# Patient Record
Sex: Male | Born: 1990 | Race: White | Hispanic: No | Marital: Single | Smoking: Current some day smoker
Health system: Southern US, Community
[De-identification: ages and names within clinical notes are randomized; demographics above are authoritative.]

## PROBLEM LIST (undated history)

## (undated) DIAGNOSIS — E119 Type 2 diabetes mellitus without complications: Secondary | ICD-10-CM

## (undated) DIAGNOSIS — F909 Attention-deficit hyperactivity disorder, unspecified type: Secondary | ICD-10-CM

## (undated) HISTORY — PX: NO PAST SURGERIES: SHX2092

---
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
## 2013-02-21 ENCOUNTER — Encounter (HOSPITAL_COMMUNITY): Payer: Self-pay | Admitting: *Deleted

## 2013-02-21 ENCOUNTER — Emergency Department (HOSPITAL_COMMUNITY)
Admission: EM | Admit: 2013-02-21 | Discharge: 2013-02-21 | Disposition: A | Discharge status: Home / Self Care | Payer: Medicaid Other | Attending: Emergency Medicine | Admitting: Emergency Medicine

## 2013-02-21 DIAGNOSIS — T162XXA Foreign body in left ear, initial encounter: Secondary | ICD-10-CM

## 2013-02-21 DIAGNOSIS — Z79899 Other long term (current) drug therapy: Secondary | ICD-10-CM | POA: Insufficient documentation

## 2013-02-21 DIAGNOSIS — T169XXA Foreign body in ear, unspecified ear, initial encounter: Secondary | ICD-10-CM | POA: Insufficient documentation

## 2013-02-21 DIAGNOSIS — F17200 Nicotine dependence, unspecified, uncomplicated: Secondary | ICD-10-CM | POA: Insufficient documentation

## 2013-02-21 DIAGNOSIS — Y9289 Other specified places as the place of occurrence of the external cause: Secondary | ICD-10-CM | POA: Insufficient documentation

## 2013-02-21 DIAGNOSIS — IMO0002 Reserved for concepts with insufficient information to code with codable children: Secondary | ICD-10-CM | POA: Insufficient documentation

## 2013-02-21 DIAGNOSIS — Y9389 Activity, other specified: Secondary | ICD-10-CM | POA: Insufficient documentation

## 2013-02-21 NOTE — ED Provider Notes (Signed)
CSN: [id number]     Arrival date & time 02/21/13  1925 History  This chart was scribed for non-physician practitioner Antony Madura, PA-C working with Shanna Cisco, MD by Danella Maiers, ED Scribe. This patient was seen in room TR04C/TR04C and the patient's care was started at 8:08 PM.    Chief Complaint  Patient presents with  . Foreign Body in Ear   Patient is a 22 y.o. male presenting with foreign body in ear. The history is provided by the patient. No language interpreter was used.  Foreign Body in Ear This is a new problem. The problem has not changed since onset.  HPI Comments: DEMETRIES COIA is a 21 y.o. male who presents to the Emergency Department complaining of a foreign body in his left ear that occurred tonight. The pt believes that part of a cotton swab got stuck in his left ear while he was cleaning it with a q-tip. Pt denies drainage, ear pain, decreased hearing. Pt is a current everyday smoker and occasional alcohol user.   History reviewed. No pertinent past medical history. History reviewed. No pertinent past surgical history. History reviewed. No pertinent family history. History  Substance Use Topics  . Smoking status: Current Every Day Smoker  . Smokeless tobacco: Not on file  . Alcohol Use: Yes    Review of Systems  HENT: Negative for ear pain and ear discharge.        Foreign body in ear  All other systems reviewed and are negative.    Allergies  Review of patient's allergies indicates not on file.  Home Medications   Current Outpatient Rx  Name  Route  Sig  Dispense  Refill  . acetaminophen (TYLENOL) 500 MG tablet   Oral   Take 1,000 mg by mouth every 6 (six) hours as needed for pain.         Marland Kitchen lisdexamfetamine (VYVANSE) 30 MG capsule   Oral   Take 30 mg by mouth every morning.         . risperidone (RISPERDAL) 4 MG tablet   Oral   Take 4 mg by mouth at bedtime.          BP 140/95  Pulse 113  Temp(Src) 98.3 F (36.8 C) (Oral)   Resp 16  SpO2 97%  Physical Exam  Nursing note and vitals reviewed. Constitutional: He is oriented to person, place, and time. He appears well-developed and well-nourished. No distress.  HENT:  Head: Normocephalic and atraumatic.  Right Ear: Tympanic membrane, external ear and ear canal normal. No mastoid tenderness. No decreased hearing is noted.  Left Ear: External ear normal. No mastoid tenderness. No decreased hearing is noted.  Nose: Nose normal.  Cotton swab deep in ear canal. Unable to visualize TM. Visible canal normal without erythema or edema. No drainage from b/l ears.  Eyes: Conjunctivae and EOM are normal. No scleral icterus.  Neck: Normal range of motion.  Cardiovascular: Regular rhythm and intact distal pulses.   Pulmonary/Chest: Effort normal. No respiratory distress.  Musculoskeletal: Normal range of motion.  Neurological: He is alert and oriented to person, place, and time.  Skin: Skin is warm and dry. No rash noted. He is not diaphoretic. No erythema. No pallor.  Psychiatric: He has a normal mood and affect. His behavior is normal.   ED Course  Medications - No data to display  DIAGNOSTIC STUDIES: Oxygen Saturation is 97% on room air, normal by my interpretation.    COORDINATION OF CARE:  9:13 PM- Discussed treatment plan with pt and pt agrees to plan.  Procedures (including critical care time)  Labs Reviewed - No data to display No results found.  1. Foreign body in ear, left, initial encounter    MDM  Uncomplicated foreign body in left ear - patient well and nontoxic appearing and afebrile. External left ear and visible canal unremarkable. Small amount of cotton visualized deep in left ear canal. Warm water flush to left ear completed to attempt to dislodge foreign body without success. Patient appropriate for discharge with ENT followup for further evaluation of symptoms. Indications for ED return discussed and patient agreeable to plan with no unaddressed  concerns.  I personally performed the services described in this documentation, which was scribed in my presence. The recorded information has been reviewed and is accurate.     Antony Madura, PA-C 02/22/13 1441

## 2013-02-21 NOTE — ED Notes (Signed)
Pt states that he was cleaning his left ear this afternoon with a q-tip and the cotton ball did not come out with the stick.

## 2013-02-24 NOTE — ED Provider Notes (Signed)
Medical screening examination/treatment/procedure(s) were performed by non-physician practitioner and as supervising physician I was immediately available for consultation/collaboration.   Dulcinea Kinser E Christopher Hink, MD 02/24/13 0655 

## 2013-03-04 ENCOUNTER — Encounter (HOSPITAL_COMMUNITY): Payer: Self-pay | Admitting: Emergency Medicine

## 2013-03-04 ENCOUNTER — Emergency Department (HOSPITAL_COMMUNITY)
Admission: EM | Admit: 2013-03-04 | Discharge: 2013-03-05 | Disposition: A | Discharge status: Home / Self Care | Payer: Medicaid Other | Attending: Emergency Medicine | Admitting: Emergency Medicine

## 2013-03-04 DIAGNOSIS — H60399 Other infective otitis externa, unspecified ear: Secondary | ICD-10-CM | POA: Insufficient documentation

## 2013-03-04 DIAGNOSIS — IMO0002 Reserved for concepts with insufficient information to code with codable children: Secondary | ICD-10-CM | POA: Insufficient documentation

## 2013-03-04 DIAGNOSIS — T169XXA Foreign body in ear, unspecified ear, initial encounter: Secondary | ICD-10-CM | POA: Insufficient documentation

## 2013-03-04 DIAGNOSIS — H6091 Unspecified otitis externa, right ear: Secondary | ICD-10-CM

## 2013-03-04 DIAGNOSIS — F17200 Nicotine dependence, unspecified, uncomplicated: Secondary | ICD-10-CM | POA: Insufficient documentation

## 2013-03-04 DIAGNOSIS — T162XXD Foreign body in left ear, subsequent encounter: Secondary | ICD-10-CM

## 2013-03-04 DIAGNOSIS — Z79899 Other long term (current) drug therapy: Secondary | ICD-10-CM | POA: Insufficient documentation

## 2013-03-04 DIAGNOSIS — Y929 Unspecified place or not applicable: Secondary | ICD-10-CM | POA: Insufficient documentation

## 2013-03-04 DIAGNOSIS — Y939 Activity, unspecified: Secondary | ICD-10-CM | POA: Insufficient documentation

## 2013-03-04 MED ORDER — CIPROFLOXACIN-DEXAMETHASONE 0.3-0.1 % OT SUSP
4.0000 [drp] | Freq: Two times a day (BID) | OTIC | Status: DC
  Administered 2013-03-04: 4 [drp] via OTIC
  Filled 2013-03-04: qty 7.5

## 2013-03-04 NOTE — ED Provider Notes (Signed)
CSN: [id number]     Arrival date & time 03/04/13  2226 History  This chart was scribed for non-physician practitioner Jaynie Crumble, PA-C working with Flint Melter, MD by Valera Castle, ED scribe. This patient was seen in room TR05C/TR05C and the patient's care was started at 10:51 PM.    Chief Complaint  Patient presents with  . Otalgia  . Foreign Body in Ear    The history is provided by the patient. No language interpreter was used.   HPI Comments: Nathan Cruz is a 22 y.o. male who presents to the Emergency Department complaining of constant, moderate left ear pain due to a foreign body in his left ear. He states that the but of a Q-tip became He reports that he was seen here in the ED on 02/21/2013 and was referred to ENT. Pt states he was here last week. He reports that the ED stated the swab was in too deep an referred him to ENT. He reports that the ENT would not see him due to insurance/financial issues. He reports that he thought the swab fell out, but the pain in his ear has since worsened. He reports ear discharge in his right ear as an associated symptoms.    History reviewed. No pertinent past medical history. History reviewed. No pertinent past surgical history. No family history on file. History  Substance Use Topics  . Smoking status: Current Every Day Smoker  . Smokeless tobacco: Not on file  . Alcohol Use: Yes    Review of Systems  HENT: Positive for ear pain (left ear) and ear discharge (right ear).   All other systems reviewed and are negative.    Allergies  Prednisone  Home Medications   Current Outpatient Rx  Name  Route  Sig  Dispense  Refill  . acetaminophen (TYLENOL) 500 MG tablet   Oral   Take 1,000 mg by mouth every 6 (six) hours as needed for pain.         Marland Kitchen lisdexamfetamine (VYVANSE) 30 MG capsule   Oral   Take 30 mg by mouth every morning.         . risperidone (RISPERDAL) 4 MG tablet   Oral   Take 4 mg by mouth at  bedtime.          Triage Vitals: BP 122/72  Pulse 103  Temp(Src) 99.3 F (37.4 C) (Oral)  Resp 14  SpO2 97%  Physical Exam  Nursing note and vitals reviewed. Constitutional: He is oriented to person, place, and time. He appears well-developed and well-nourished.  HENT:  Head: Normocephalic and atraumatic.  White fuzzy foreign body in left ear canal. Ear canal appears edematous, purulent drainage noted. Swelling in his right ear canal with purulent drainage. TM normal.   Eyes: EOM are normal.  Neck: Normal range of motion. Neck supple.  Cardiovascular: Normal rate.   Pulmonary/Chest: Effort normal.  Musculoskeletal: Normal range of motion.  Lymphadenopathy:    He has no cervical adenopathy.  Neurological: He is alert and oriented to person, place, and time.  Skin: Skin is warm and dry.  Psychiatric: He has a normal mood and affect. His behavior is normal.    ED Course  FOREIGN BODY REMOVAL Date/Time: 03/04/2013 11:35 PM Performed by: Jaynie Crumble A Authorized by: Jaynie Crumble A Consent: Verbal consent obtained. written consent not obtained. Risks and benefits: risks, benefits and alternatives were discussed Consent given by: patient Patient understanding: patient states understanding of the procedure being performed  Patient identity confirmed: verbally with patient and arm band Time out: Immediately prior to procedure a "time out" was called to verify the correct patient, procedure, equipment, support staff and site/side marked as required. Body area: ear Local anesthetic: topical anesthetic Anesthetic total: 2 drops Patient sedated: no Patient restrained: no Patient cooperative: yes Localization method: visualized Removal mechanism: alligator forceps Complexity: simple 1 objects recovered. Objects recovered: tip of the Q-tip Post-procedure assessment: foreign body removed Patient tolerance: Patient tolerated the procedure well with no immediate  complications.   (including critical care time)  DIAGNOSTIC STUDIES: Oxygen Saturation is 97% on room air, normal by my interpretation.    COORDINATION OF CARE: 11:00 PM-Discussed treatment plan which includes removal of the foreign object with pt at bedside and pt agreed to plan. Discussed clinical suspicion of an infection in his right ear.   11:19 PM Removed foreign object from left ear. Advised pt to use ear drops to help with both ears.   Labs Review Labs Reviewed - No data to display Imaging Review No results found.  MDM   1. Foreign body in ear, left, subsequent encounter   2. Otitis externa of right ear     Patient with a tip of the Q-tip in his ear for a week. Was seen a week ago, they were unable to remove the Q-tip from his ear. He was referred to ENT Dr. who he was unable to see because they would not accept his insurance. Patient states that now appears becoming more painful. Draining. Patient also reports drainage and swelling of the right ear canal as well. On exam he does have right otitis externa. He does have visualized Q-tip in his left ear canal. I was able to remove it using alligator forceps. Patient tolerated procedure well. I will treat his right otitis externa with Ciprodex ear drops. He'll followup with his doctor as needed  I personally performed the services described in this documentation, which was scribed in my presence. The recorded information has been reviewed and is accurate.   Lottie Mussel, PA-C 03/04/13 2340

## 2013-03-04 NOTE — ED Notes (Signed)
Pt. reports left ear ache / foreign object ( cotton bud) at left ear , seen here last 8/24 and was referred to otolaryngologist .

## 2013-03-04 NOTE — ED Notes (Signed)
Pharmacy paged for Ciprodex, ED pyxis not loaded with medication

## 2013-03-05 NOTE — ED Provider Notes (Signed)
Medical screening examination/treatment/procedure(s) were performed by non-physician practitioner and as supervising physician I was immediately available for consultation/collaboration.  Amdrew Oboyle L Layia Walla, MD 03/05/13 0043 

## 2014-12-17 ENCOUNTER — Emergency Department (INDEPENDENT_AMBULATORY_CARE_PROVIDER_SITE_OTHER)
Admission: EM | Admit: 2014-12-17 | Discharge: 2014-12-17 | Disposition: A | Discharge status: Home / Self Care | Payer: Medicaid Other | Source: Home / Self Care | Attending: Emergency Medicine | Admitting: Emergency Medicine

## 2014-12-17 ENCOUNTER — Encounter (HOSPITAL_COMMUNITY): Payer: Self-pay | Admitting: Emergency Medicine

## 2014-12-17 DIAGNOSIS — H6123 Impacted cerumen, bilateral: Secondary | ICD-10-CM

## 2014-12-17 MED ORDER — NEOMYCIN-POLYMYXIN-HC 3.5-10000-1 OT SUSP
OTIC | Status: AC
  Filled 2014-12-17: qty 10

## 2014-12-17 MED ORDER — CIPROFLOXACIN-DEXAMETHASONE 0.3-0.1 % OT SUSP: 4.0000 [drp] | Freq: Two times a day (BID) | OTIC | Status: DC

## 2014-12-17 NOTE — Discharge Instructions (Signed)
You had ear wax blocking the ears. We removed this today, you have a little bit of irritation in your ear canals from this. Use the Ciprodex eardrops twice a day for the next 5 days to prevent infection. If your ears start to feel stopped up, put a drop of olive oil or vegetable oil in each ear at bedtime. Follow-up as needed.

## 2014-12-17 NOTE — ED Notes (Signed)
C/o right ear fullness onset 1 month; voices no other concerns Has been using brother Rx for Ciprodex ear drops w/no relief Alert, no signs of acute distress.

## 2014-12-17 NOTE — ED Provider Notes (Signed)
CSN: [id number]     Arrival date & time 12/17/14  1516 History   First MD Initiated Contact with Patient 12/17/14 1551     Chief Complaint  Patient presents with  . Ear Fullness   (Consider location/radiation/quality/duration/timing/severity/associated sxs/prior Treatment) HPI He is a 24 year old man here for evaluation of right ear fullness. He states this is been going on for about a month, but worse in the last few days. He reports a full sensation all the time. With hiccuping or burping he will have brief pain in the right ear. His hearing is decreased on that side. No fevers or chills. No drainage from the ear.  History reviewed. No pertinent past medical history. History reviewed. No pertinent past surgical history. No family history on file. History  Substance Use Topics  . Smoking status: Current Every Day Smoker  . Smokeless tobacco: Not on file  . Alcohol Use: Yes    Review of Systems As in history of present illness Allergies  Prednisone  Home Medications   Prior to Admission medications   Medication Sig Start Date End Date Taking? Authorizing Provider  risperidone (RISPERDAL) 4 MG tablet Take 4 mg by mouth at bedtime.   Yes Historical Provider, MD  acetaminophen (TYLENOL) 500 MG tablet Take 1,000 mg by mouth every 6 (six) hours as needed for pain.    Historical Provider, MD  lisdexamfetamine (VYVANSE) 30 MG capsule Take 30 mg by mouth every morning.    Historical Provider, MD   BP 118/82 mmHg  Pulse 99  Temp(Src) 98.5 F (36.9 C) (Oral)  Resp 16  SpO2 100% Physical Exam  Constitutional: He is oriented to person, place, and time. He appears well-developed and well-nourished. No distress.  HENT:  Bilateral eardrums are obscured by cerumen. Cerumen appears compacted in the right ear.  Eyes: Conjunctivae are normal.  Cardiovascular: Normal rate.   Pulmonary/Chest: Effort normal.  Neurological: He is alert and oriented to person, place, and time.    ED Course   Procedures (including critical care time) Labs Review Labs Reviewed - No data to display  Imaging Review No results found.   MDM  No diagnosis found. Bilateral ears washed out.  Bilateral TMs are clear after washout. His symptoms are resolved. There is a little erythema of the ear canals from the wax removal. We'll place on Ciprodex eardrops for 5 days to prevent infection. Follow-up as needed.    Melony Overly, MD 12/17/14 1655

## 2015-08-01 DIAGNOSIS — H6121 Impacted cerumen, right ear: Secondary | ICD-10-CM | POA: Diagnosis not present

## 2016-02-28 DIAGNOSIS — F250 Schizoaffective disorder, bipolar type: Secondary | ICD-10-CM | POA: Diagnosis not present

## 2016-06-07 DIAGNOSIS — Z23 Encounter for immunization: Secondary | ICD-10-CM | POA: Diagnosis not present

## 2016-12-10 ENCOUNTER — Encounter (HOSPITAL_COMMUNITY): Payer: Self-pay | Admitting: *Deleted

## 2016-12-10 ENCOUNTER — Ambulatory Visit (HOSPITAL_COMMUNITY)
Admission: EM | Admit: 2016-12-10 | Discharge: 2016-12-10 | Disposition: A | Discharge status: Home / Self Care | Payer: Medicaid Other | Attending: Family Medicine | Admitting: Family Medicine

## 2016-12-10 DIAGNOSIS — R05 Cough: Secondary | ICD-10-CM | POA: Diagnosis not present

## 2016-12-10 DIAGNOSIS — R059 Cough, unspecified: Secondary | ICD-10-CM

## 2016-12-10 DIAGNOSIS — H6121 Impacted cerumen, right ear: Secondary | ICD-10-CM

## 2016-12-10 MED ORDER — BENZONATATE 100 MG PO CAPS: 100.0000 mg | ORAL_CAPSULE | Freq: Three times a day (TID) | ORAL | 0 refills | Status: DC

## 2016-12-10 NOTE — ED Triage Notes (Signed)
Pt  Reports    Symptoms  Of  Cough   Congested     With     Symptoms  For  Over    1  Month     -  Pt  Also  Reports   He  Has   Ear    Feeling   Stopped  Up  As   Well    Pt   Sitting  Upright  On the  Exam table   Speaking  In  Complete   sentances

## 2016-12-10 NOTE — ED Provider Notes (Signed)
CSN: [id number]     Arrival date & time 12/10/16  1001 History   None    Chief Complaint  Patient presents with  . URI   (Consider location/radiation/quality/duration/timing/severity/associated sxs/prior Treatment) Patient c/o right ear cerumen impaction and cough.   The history is provided by the patient.  URI  Presenting symptoms: cough and ear pain   Severity:  Moderate Onset quality:  Sudden Duration:  1 day Timing:  Constant Progression:  Worsening Chronicity:  New Relieved by:  Nothing Worsened by:  Nothing Ineffective treatments:  None tried   History reviewed. No pertinent past medical history. History reviewed. No pertinent surgical history. History reviewed. No pertinent family history. Social History  Substance Use Topics  . Smoking status: Current Every Day Smoker  . Smokeless tobacco: Not on file  . Alcohol use Yes    Review of Systems  Constitutional: Negative.   HENT: Positive for ear pain.   Eyes: Negative.   Respiratory: Positive for cough.   Cardiovascular: Negative.   Gastrointestinal: Negative.   Endocrine: Negative.   Genitourinary: Negative.   Musculoskeletal: Negative.   Allergic/Immunologic: Negative.   Neurological: Negative.   Hematological: Negative.   Psychiatric/Behavioral: Negative.     Allergies  Prednisone  Home Medications   Prior to Admission medications   Medication Sig Start Date End Date Taking? Authorizing Provider  acetaminophen (TYLENOL) 500 MG tablet Take 1,000 mg by mouth every 6 (six) hours as needed for pain.    [provider]  ciprofloxacin-dexamethasone (CIPRODEX) otic suspension Place 4 drops into both ears 2 (two) times daily. For 5 days 12/17/14   Melony Overly, MD  lisdexamfetamine (VYVANSE) 30 MG capsule Take 30 mg by mouth every morning.    [provider]  risperidone (RISPERDAL) 4 MG tablet Take 4 mg by mouth at bedtime.    [provider]   Meds Ordered and Administered  this Visit  Medications - No data to display  BP 132/78 (BP Location: Right Arm)   Pulse 72   Temp 98.6 F (37 C) (Oral)   Resp 18   SpO2 100%  No data found.   Physical Exam  Constitutional: He is oriented to person, place, and time. He appears well-developed and well-nourished.  HENT:  Head: Normocephalic and atraumatic.  Left Ear: External ear normal.  Mouth/Throat: Oropharynx is clear and moist.  AD cerumen impaction.  After irrigation right EAC is clear and TM is wnl.  Left TM and EAC wnl  Eyes: Conjunctivae and EOM are normal. Pupils are equal, round, and reactive to light.  Neck: Normal range of motion. Neck supple.  Cardiovascular: Normal rate, regular rhythm and normal heart sounds.   Pulmonary/Chest: Effort normal and breath sounds normal.  Neurological: He is alert and oriented to person, place, and time.  Nursing note and vitals reviewed.   Urgent Care Course     Procedures (including critical care time)  Labs Review Labs Reviewed - No data to display  Imaging Review No results found.   Visual Acuity Review  Right Eye Distance:   Left Eye Distance:   Bilateral Distance:    Right Eye Near:   Left Eye Near:    Bilateral Near:         MDM   1. Impacted cerumen of right ear   2. Cough    Irrigation right ear Tessalon Perles 100mg  one po tid prn cough #21     Lysbeth Penner, FNP 12/10/16 1129

## 2017-01-01 ENCOUNTER — Encounter (HOSPITAL_BASED_OUTPATIENT_CLINIC_OR_DEPARTMENT_OTHER): Payer: Self-pay | Admitting: *Deleted

## 2017-01-01 ENCOUNTER — Emergency Department (HOSPITAL_BASED_OUTPATIENT_CLINIC_OR_DEPARTMENT_OTHER)
Admission: EM | Admit: 2017-01-01 | Discharge: 2017-01-01 | Disposition: A | Discharge status: Home / Self Care | Payer: Medicare Other | Attending: Emergency Medicine | Admitting: Emergency Medicine

## 2017-01-01 DIAGNOSIS — X19XXXA Contact with other heat and hot substances, initial encounter: Secondary | ICD-10-CM | POA: Insufficient documentation

## 2017-01-01 DIAGNOSIS — Y999 Unspecified external cause status: Secondary | ICD-10-CM | POA: Insufficient documentation

## 2017-01-01 DIAGNOSIS — F17200 Nicotine dependence, unspecified, uncomplicated: Secondary | ICD-10-CM | POA: Insufficient documentation

## 2017-01-01 DIAGNOSIS — T25222A Burn of second degree of left foot, initial encounter: Secondary | ICD-10-CM | POA: Diagnosis not present

## 2017-01-01 DIAGNOSIS — Y929 Unspecified place or not applicable: Secondary | ICD-10-CM | POA: Insufficient documentation

## 2017-01-01 DIAGNOSIS — Z79899 Other long term (current) drug therapy: Secondary | ICD-10-CM | POA: Diagnosis not present

## 2017-01-01 DIAGNOSIS — S99922A Unspecified injury of left foot, initial encounter: Secondary | ICD-10-CM | POA: Diagnosis present

## 2017-01-01 DIAGNOSIS — Y939 Activity, unspecified: Secondary | ICD-10-CM | POA: Diagnosis not present

## 2017-01-01 DIAGNOSIS — X58XXXA Exposure to other specified factors, initial encounter: Secondary | ICD-10-CM | POA: Diagnosis not present

## 2017-01-01 DIAGNOSIS — T310 Burns involving less than 10% of body surface: Secondary | ICD-10-CM | POA: Diagnosis not present

## 2017-01-01 DIAGNOSIS — Z23 Encounter for immunization: Secondary | ICD-10-CM | POA: Insufficient documentation

## 2017-01-01 MED ORDER — BACITRACIN ZINC 500 UNIT/GM EX OINT
TOPICAL_OINTMENT | CUTANEOUS | Status: AC
  Filled 2017-01-01: qty 28.35

## 2017-01-01 MED ORDER — BACITRACIN ZINC 500 UNIT/GM EX OINT
TOPICAL_OINTMENT | Freq: Once | CUTANEOUS | Status: AC
  Administered 2017-01-01: 23:00:00 via TOPICAL

## 2017-01-01 MED ORDER — NAPROXEN 500 MG PO TABS
500.0000 mg | ORAL_TABLET | Freq: Two times a day (BID) | ORAL | 0 refills | Status: DC
Start: 2017-01-01 — End: 2018-08-23

## 2017-01-01 MED ORDER — NAPROXEN 250 MG PO TABS
500.0000 mg | ORAL_TABLET | Freq: Once | ORAL | Status: AC
  Administered 2017-01-01: 500 mg via ORAL
  Filled 2017-01-01: qty 2

## 2017-01-01 MED ORDER — TETANUS-DIPHTH-ACELL PERTUSSIS 5-2.5-18.5 LF-MCG/0.5 IM SUSP
0.5000 mL | Freq: Once | INTRAMUSCULAR | Status: AC
  Administered 2017-01-01: 0.5 mL via INTRAMUSCULAR

## 2017-01-01 MED ORDER — TETANUS-DIPHTH-ACELL PERTUSSIS 5-2.5-18.5 LF-MCG/0.5 IM SUSP
INTRAMUSCULAR | Status: AC
  Filled 2017-01-01: qty 0.5

## 2017-01-01 NOTE — ED Triage Notes (Signed)
He stepped on hot charcoal an hour ago. Blisters noted to the bottom of his left foot. He took Aleve.
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
## 2017-01-01 NOTE — Discharge Instructions (Signed)
Take the Naprosyn as needed every 12 hours. Elevate left foot is much as possible. Soak it in warm water for 20 minutes twice a day. Then apply antibiotic ointment packs a trace and Polysporin or Neosporin to the open wounds. Apply dressing. Use crutches as directed. Do not want any weightbearing on the foot into the blister settle down. Return here for wound recheck in 2 days. That would be on Friday.

## 2017-01-03 ENCOUNTER — Emergency Department (HOSPITAL_BASED_OUTPATIENT_CLINIC_OR_DEPARTMENT_OTHER)
Admission: EM | Admit: 2017-01-03 | Discharge: 2017-01-03 | Disposition: A | Discharge status: Home / Self Care | Payer: Medicare Other | Attending: Emergency Medicine | Admitting: Emergency Medicine

## 2017-01-03 ENCOUNTER — Encounter (HOSPITAL_BASED_OUTPATIENT_CLINIC_OR_DEPARTMENT_OTHER): Payer: Self-pay | Admitting: Emergency Medicine

## 2017-01-03 DIAGNOSIS — T25222D Burn of second degree of left foot, subsequent encounter: Secondary | ICD-10-CM | POA: Diagnosis not present

## 2017-01-03 DIAGNOSIS — Y9289 Other specified places as the place of occurrence of the external cause: Secondary | ICD-10-CM | POA: Diagnosis not present

## 2017-01-03 DIAGNOSIS — T25222A Burn of second degree of left foot, initial encounter: Secondary | ICD-10-CM | POA: Diagnosis not present

## 2017-01-03 DIAGNOSIS — Y939 Activity, unspecified: Secondary | ICD-10-CM | POA: Insufficient documentation

## 2017-01-03 DIAGNOSIS — X19XXXD Contact with other heat and hot substances, subsequent encounter: Secondary | ICD-10-CM | POA: Insufficient documentation

## 2017-01-03 DIAGNOSIS — Y999 Unspecified external cause status: Secondary | ICD-10-CM | POA: Insufficient documentation

## 2017-01-03 MED ORDER — SILVER SULFADIAZINE 1 % EX CREA: 1.0000 "application " | TOPICAL_CREAM | Freq: Every day | CUTANEOUS | 0 refills | Status: DC

## 2017-01-03 MED ORDER — SILVER SULFADIAZINE 1 % EX CREA
TOPICAL_CREAM | CUTANEOUS | Status: AC
  Filled 2017-01-03: qty 85

## 2017-01-03 MED ORDER — SILVER SULFADIAZINE 1 % EX CREA
TOPICAL_CREAM | Freq: Once | CUTANEOUS | Status: AC
  Administered 2017-01-03: 15:00:00 via TOPICAL

## 2017-01-03 NOTE — ED Triage Notes (Signed)
Patient reports here for foot recheck per MD order.
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
## 2017-01-06 DIAGNOSIS — T25222A Burn of second degree of left foot, initial encounter: Secondary | ICD-10-CM | POA: Diagnosis not present

## 2017-01-06 DIAGNOSIS — T25232A Burn of second degree of left toe(s) (nail), initial encounter: Secondary | ICD-10-CM | POA: Insufficient documentation

## 2017-01-14 DIAGNOSIS — T25232A Burn of second degree of left toe(s) (nail), initial encounter: Secondary | ICD-10-CM | POA: Diagnosis not present

## 2017-01-14 DIAGNOSIS — T25222A Burn of second degree of left foot, initial encounter: Secondary | ICD-10-CM | POA: Diagnosis not present

## 2017-01-28 DIAGNOSIS — Z09 Encounter for follow-up examination after completed treatment for conditions other than malignant neoplasm: Secondary | ICD-10-CM | POA: Diagnosis not present

## 2017-05-09 DIAGNOSIS — Z23 Encounter for immunization: Secondary | ICD-10-CM | POA: Diagnosis not present

## 2018-04-24 DIAGNOSIS — Z23 Encounter for immunization: Secondary | ICD-10-CM | POA: Diagnosis not present

## 2018-08-23 ENCOUNTER — Other Ambulatory Visit: Payer: Self-pay

## 2018-08-23 ENCOUNTER — Ambulatory Visit (HOSPITAL_COMMUNITY)
Admission: EM | Admit: 2018-08-23 | Discharge: 2018-08-23 | Disposition: A | Discharge status: Home / Self Care | Payer: Medicare Other | Attending: Internal Medicine | Admitting: Internal Medicine

## 2018-08-23 ENCOUNTER — Encounter (HOSPITAL_COMMUNITY): Payer: Self-pay | Admitting: Emergency Medicine

## 2018-08-23 DIAGNOSIS — R51 Headache: Secondary | ICD-10-CM

## 2018-08-23 DIAGNOSIS — G8929 Other chronic pain: Secondary | ICD-10-CM

## 2018-08-23 HISTORY — DX: Attention-deficit hyperactivity disorder, unspecified type: F90.9

## 2018-08-23 NOTE — ED Triage Notes (Signed)
The patient presented to the [facility] with a complaint of a headache x 1 week. The patient reported on and off headaches x 1 year.
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
## 2018-09-09 DIAGNOSIS — E1165 Type 2 diabetes mellitus with hyperglycemia: Secondary | ICD-10-CM | POA: Diagnosis not present

## 2018-09-09 DIAGNOSIS — Z13220 Encounter for screening for lipoid disorders: Secondary | ICD-10-CM | POA: Diagnosis not present

## 2018-09-09 DIAGNOSIS — G44229 Chronic tension-type headache, not intractable: Secondary | ICD-10-CM | POA: Diagnosis not present

## 2019-03-25 DIAGNOSIS — E663 Overweight: Secondary | ICD-10-CM | POA: Diagnosis not present

## 2019-03-25 DIAGNOSIS — Z23 Encounter for immunization: Secondary | ICD-10-CM | POA: Diagnosis not present

## 2019-03-25 DIAGNOSIS — Z8659 Personal history of other mental and behavioral disorders: Secondary | ICD-10-CM | POA: Diagnosis not present

## 2019-03-25 DIAGNOSIS — E1169 Type 2 diabetes mellitus with other specified complication: Secondary | ICD-10-CM | POA: Diagnosis not present

## 2019-03-25 DIAGNOSIS — E119 Type 2 diabetes mellitus without complications: Secondary | ICD-10-CM | POA: Diagnosis not present

## 2019-03-25 DIAGNOSIS — Z113 Encounter for screening for infections with a predominantly sexual mode of transmission: Secondary | ICD-10-CM | POA: Diagnosis not present

## 2019-03-25 DIAGNOSIS — R51 Headache: Secondary | ICD-10-CM | POA: Diagnosis not present

## 2019-04-06 DIAGNOSIS — E663 Overweight: Secondary | ICD-10-CM | POA: Diagnosis not present

## 2019-04-14 DIAGNOSIS — E1169 Type 2 diabetes mellitus with other specified complication: Secondary | ICD-10-CM | POA: Diagnosis not present

## 2019-04-14 DIAGNOSIS — R7989 Other specified abnormal findings of blood chemistry: Secondary | ICD-10-CM | POA: Diagnosis not present

## 2019-04-14 DIAGNOSIS — E663 Overweight: Secondary | ICD-10-CM | POA: Diagnosis not present

## 2019-05-12 DIAGNOSIS — E8809 Other disorders of plasma-protein metabolism, not elsewhere classified: Secondary | ICD-10-CM | POA: Diagnosis not present

## 2019-05-12 DIAGNOSIS — E1169 Type 2 diabetes mellitus with other specified complication: Secondary | ICD-10-CM | POA: Diagnosis not present

## 2019-05-12 DIAGNOSIS — Z8659 Personal history of other mental and behavioral disorders: Secondary | ICD-10-CM | POA: Diagnosis not present

## 2019-05-12 DIAGNOSIS — E663 Overweight: Secondary | ICD-10-CM | POA: Diagnosis not present

## 2019-05-12 DIAGNOSIS — R519 Headache, unspecified: Secondary | ICD-10-CM | POA: Diagnosis not present

## 2019-05-12 DIAGNOSIS — R7989 Other specified abnormal findings of blood chemistry: Secondary | ICD-10-CM | POA: Diagnosis not present

## 2019-09-03 ENCOUNTER — Encounter: Payer: Self-pay | Admitting: Internal Medicine

## 2019-12-14 ENCOUNTER — Ambulatory Visit (HOSPITAL_COMMUNITY)
Admission: EM | Admit: 2019-12-14 | Discharge: 2019-12-14 | Disposition: A | Discharge status: Home / Self Care | Payer: Medicare Other | Attending: Family Medicine | Admitting: Family Medicine

## 2019-12-14 ENCOUNTER — Encounter (HOSPITAL_COMMUNITY): Payer: Self-pay

## 2019-12-14 ENCOUNTER — Other Ambulatory Visit: Payer: Self-pay

## 2019-12-14 DIAGNOSIS — H6123 Impacted cerumen, bilateral: Secondary | ICD-10-CM | POA: Diagnosis not present

## 2019-12-14 NOTE — ED Triage Notes (Signed)
Pt c/o hearing loss to right ear, has had to have ears irrigated before.

## 2019-12-14 NOTE — Discharge Instructions (Addendum)
We flushed the ears out today.  No signs of infection. Recommend Debrox over-the-counter to soften earwax in the future Follow up as needed for continued or worsening symptoms
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
## 2020-08-03 DIAGNOSIS — Z23 Encounter for immunization: Secondary | ICD-10-CM | POA: Diagnosis not present

## 2020-08-03 DIAGNOSIS — E781 Pure hyperglyceridemia: Secondary | ICD-10-CM | POA: Diagnosis not present

## 2020-08-03 DIAGNOSIS — R519 Headache, unspecified: Secondary | ICD-10-CM | POA: Diagnosis not present

## 2020-08-03 DIAGNOSIS — E1169 Type 2 diabetes mellitus with other specified complication: Secondary | ICD-10-CM | POA: Diagnosis not present

## 2020-08-03 DIAGNOSIS — H6120 Impacted cerumen, unspecified ear: Secondary | ICD-10-CM | POA: Diagnosis not present

## 2020-08-03 DIAGNOSIS — E663 Overweight: Secondary | ICD-10-CM | POA: Diagnosis not present

## 2020-08-12 ENCOUNTER — Other Ambulatory Visit: Payer: Self-pay

## 2020-08-12 ENCOUNTER — Emergency Department (HOSPITAL_COMMUNITY): Payer: Medicare Other

## 2020-08-12 ENCOUNTER — Emergency Department (HOSPITAL_COMMUNITY)
Admission: EM | Admit: 2020-08-12 | Discharge: 2020-08-12 | Disposition: A | Discharge status: Home / Self Care | Payer: Medicare Other | Attending: Emergency Medicine | Admitting: Emergency Medicine

## 2020-08-12 ENCOUNTER — Encounter (HOSPITAL_COMMUNITY): Payer: Self-pay | Admitting: Emergency Medicine

## 2020-08-12 DIAGNOSIS — F17200 Nicotine dependence, unspecified, uncomplicated: Secondary | ICD-10-CM | POA: Insufficient documentation

## 2020-08-12 DIAGNOSIS — R1084 Generalized abdominal pain: Secondary | ICD-10-CM | POA: Insufficient documentation

## 2020-08-12 DIAGNOSIS — R1013 Epigastric pain: Secondary | ICD-10-CM | POA: Diagnosis present

## 2020-08-12 DIAGNOSIS — R109 Unspecified abdominal pain: Secondary | ICD-10-CM | POA: Diagnosis not present

## 2020-08-12 DIAGNOSIS — E119 Type 2 diabetes mellitus without complications: Secondary | ICD-10-CM | POA: Diagnosis not present

## 2020-08-12 HISTORY — DX: Type 2 diabetes mellitus without complications: E11.9

## 2020-08-12 LAB — URINALYSIS, ROUTINE W REFLEX MICROSCOPIC
Bilirubin Urine: NEGATIVE
Glucose, UA: NEGATIVE mg/dL
Hgb urine dipstick: NEGATIVE
Ketones, ur: NEGATIVE mg/dL
Leukocytes,Ua: NEGATIVE
Nitrite: NEGATIVE
Protein, ur: NEGATIVE mg/dL
Specific Gravity, Urine: 1.005 (ref 1.005–1.030)
pH: 6 (ref 5.0–8.0)

## 2020-08-12 LAB — CBC
HCT: 43.2 % (ref 39.0–52.0)
Hemoglobin: 14.3 g/dL (ref 13.0–17.0)
MCH: 27.2 pg (ref 26.0–34.0)
MCHC: 33.1 g/dL (ref 30.0–36.0)
MCV: 82.3 fL (ref 80.0–100.0)
Platelets: 285 10*3/uL (ref 150–400)
RBC: 5.25 MIL/uL (ref 4.22–5.81)
RDW: 12.9 % (ref 11.5–15.5)
WBC: 8.7 10*3/uL (ref 4.0–10.5)
nRBC: 0 % (ref 0.0–0.2)

## 2020-08-12 LAB — COMPREHENSIVE METABOLIC PANEL
ALT: 36 U/L (ref 0–44)
AST: 27 U/L (ref 15–41)
Albumin: 4.8 g/dL (ref 3.5–5.0)
Alkaline Phosphatase: 60 U/L (ref 38–126)
Anion gap: 12 (ref 5–15)
BUN: 9 mg/dL (ref 6–20)
CO2: 24 mmol/L (ref 22–32)
Calcium: 10.1 mg/dL (ref 8.9–10.3)
Chloride: 103 mmol/L (ref 98–111)
Creatinine, Ser: 0.94 mg/dL (ref 0.61–1.24)
GFR, Estimated: >60 mL/min (ref >60)
Glucose, Bld: 135 mg/dL — ABNORMAL HIGH (ref 70–99)
Potassium: 3.6 mmol/L (ref 3.5–5.1)
Sodium: 139 mmol/L (ref 135–145)
Total Bilirubin: 1.1 mg/dL (ref 0.3–1.2)
Total Protein: 8.5 g/dL — ABNORMAL HIGH (ref 6.5–8.1)

## 2020-08-12 LAB — LIPASE, BLOOD: Lipase: 47 U/L (ref 11–51)

## 2020-08-12 MED ORDER — POLYETHYLENE GLYCOL 3350 17 G PO PACK: 17.0000 g | PACK | Freq: Every day | ORAL | 0 refills | Status: DC

## 2020-08-12 NOTE — ED Notes (Signed)
Patient transported to x-ray. ?
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
## 2020-08-12 NOTE — ED Triage Notes (Signed)
Patient with constant feeling of having to have a BM for the last few days.  He states that he has been smoking a lot of Delta 8.  Patient having trouble eating also.  Patient states that he has not been able to do anything due to the distraction of having to go to the bathroom all the time.  No nausea or vomiting.

## 2020-08-24 DIAGNOSIS — L718 Other rosacea: Secondary | ICD-10-CM | POA: Diagnosis not present

## 2020-08-24 DIAGNOSIS — L218 Other seborrheic dermatitis: Secondary | ICD-10-CM | POA: Diagnosis not present

## 2020-09-11 ENCOUNTER — Other Ambulatory Visit: Payer: Self-pay

## 2020-09-11 ENCOUNTER — Ambulatory Visit (HOSPITAL_COMMUNITY)
Admission: EM | Admit: 2020-09-11 | Discharge: 2020-09-11 | Disposition: A | Discharge status: Home / Self Care | Payer: Medicare Other | Attending: Psychiatry | Admitting: Psychiatry

## 2020-09-11 DIAGNOSIS — Z62810 Personal history of physical and sexual abuse in childhood: Secondary | ICD-10-CM | POA: Insufficient documentation

## 2020-09-11 DIAGNOSIS — F329 Major depressive disorder, single episode, unspecified: Secondary | ICD-10-CM
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
## 2020-09-11 NOTE — Discharge Instructions (Signed)
Please come to [facility] (this facility) during walk in hours for appointment with psychiatrist for further medication management and for therapy.   Walk in hours are 8-11 AM Monday through Thursday for medication management.It is first come, first -serve; it is best to arrive by 7:00 AM. On Friday from 1 pm to 4 pm for therapy intake only. Please arrive by 12:00 pm as it is  first come, first -serve.   When you arrive please go upstairs for your appointment. If you are unsure of where to go, inform the front desk that you are here for a walk in appointment and they will assist you with directions upstairs.  Address:  [address], in [place], [place] Ph: [phone]

## 2020-09-11 NOTE — Progress Notes (Signed)
   09/11/20 1525  [place] (Walk-ins at [facility] only)  How Did You Hear About [place]? Legal System  What Is the Reason for Your Visit/Call Today? Patient presents voluntarily via [employer].  How Long Has This Been Causing You Problems? <Week  Have You Recently Had Any Thoughts About Hurting Yourself? No  Are You Planning to Commit Suicide/Harm Yourself At This time? No  Have you Recently Had Thoughts About [place]? No  Are You Planning To Harm Someone At This Time? No  Are you currently experiencing any auditory, visual or other hallucinations? No  Have You Used Any Alcohol or Drugs in the Past 24 Hours? No  Do you have any current medical co-morbidities that require immediate attention? No  Clinician description of patient physical appearance/behavior: Patient is calm and cooperative.  What Do You Feel Would Help You the Most Today? Treatment for Depression or other mood problem;Medication(s)  If access to [facility] was not available, would you have sought care in the Emergency Department? No  Determination of Need Routine (7 days)  Options For Referral Medication Management;Outpatient Therapy
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
## 2020-09-11 NOTE — ED Notes (Signed)
Pt discharged with [employer] escort back to home. Denies SI/HI/AVH. Verbalized understanding of AVS instructions reviewed by RN. Safety maintained.

## 2020-09-12 ENCOUNTER — Telehealth (HOSPITAL_COMMUNITY): Payer: Self-pay | Admitting: Internal Medicine

## 2020-09-12 NOTE — Telephone Encounter (Signed)
Care Management - Follow Up [facility] Discharges   Writer attempted to make contact with patient today and was unsuccessful.    Phone just rang, unable to leave a voice mail message.

## 2020-09-13 ENCOUNTER — Other Ambulatory Visit: Payer: Self-pay

## 2020-09-13 ENCOUNTER — Encounter (HOSPITAL_COMMUNITY): Payer: Self-pay | Admitting: Psychiatry

## 2020-09-13 ENCOUNTER — Ambulatory Visit (INDEPENDENT_AMBULATORY_CARE_PROVIDER_SITE_OTHER): Payer: Medicare Other | Admitting: Psychiatry

## 2020-09-13 VITALS — BP 117/91 | HR 102 | Ht 69.0 in | Wt 193.0 lb

## 2020-09-13 DIAGNOSIS — F39 Unspecified mood [affective] disorder: Secondary | ICD-10-CM

## 2020-09-13 DIAGNOSIS — R4183 Borderline intellectual functioning: Secondary | ICD-10-CM | POA: Diagnosis not present

## 2020-09-13 MED ORDER — QUETIAPINE FUMARATE 100 MG PO TABS: 100.0000 mg | ORAL_TABLET | Freq: Every day | ORAL | 1 refills | Status: DC
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
## 2020-09-26 DIAGNOSIS — E1169 Type 2 diabetes mellitus with other specified complication: Secondary | ICD-10-CM | POA: Diagnosis not present

## 2020-09-26 DIAGNOSIS — E781 Pure hyperglyceridemia: Secondary | ICD-10-CM | POA: Diagnosis not present

## 2020-09-27 DIAGNOSIS — R109 Unspecified abdominal pain: Secondary | ICD-10-CM | POA: Diagnosis not present

## 2020-09-27 DIAGNOSIS — E1169 Type 2 diabetes mellitus with other specified complication: Secondary | ICD-10-CM | POA: Diagnosis not present

## 2020-09-27 DIAGNOSIS — Z1331 Encounter for screening for depression: Secondary | ICD-10-CM | POA: Diagnosis not present

## 2020-09-27 DIAGNOSIS — Z0000 Encounter for general adult medical examination without abnormal findings: Secondary | ICD-10-CM | POA: Diagnosis not present

## 2020-09-27 DIAGNOSIS — H6120 Impacted cerumen, unspecified ear: Secondary | ICD-10-CM | POA: Diagnosis not present

## 2020-09-27 DIAGNOSIS — E663 Overweight: Secondary | ICD-10-CM | POA: Diagnosis not present

## 2020-09-27 DIAGNOSIS — R82998 Other abnormal findings in urine: Secondary | ICD-10-CM | POA: Diagnosis not present

## 2020-09-27 DIAGNOSIS — Z1339 Encounter for screening examination for other mental health and behavioral disorders: Secondary | ICD-10-CM | POA: Diagnosis not present

## 2020-09-27 DIAGNOSIS — E781 Pure hyperglyceridemia: Secondary | ICD-10-CM | POA: Diagnosis not present

## 2020-09-27 DIAGNOSIS — R519 Headache, unspecified: Secondary | ICD-10-CM | POA: Diagnosis not present

## 2020-09-28 ENCOUNTER — Other Ambulatory Visit: Payer: Self-pay | Admitting: Internal Medicine

## 2020-09-28 DIAGNOSIS — R1084 Generalized abdominal pain: Secondary | ICD-10-CM

## 2020-09-29 ENCOUNTER — Other Ambulatory Visit: Payer: Self-pay

## 2020-09-29 ENCOUNTER — Ambulatory Visit (INDEPENDENT_AMBULATORY_CARE_PROVIDER_SITE_OTHER): Payer: Medicare Other | Admitting: Clinical

## 2020-09-29 DIAGNOSIS — F331 Major depressive disorder, recurrent, moderate: Secondary | ICD-10-CM | POA: Diagnosis not present

## 2020-09-29 DIAGNOSIS — R4183 Borderline intellectual functioning: Secondary | ICD-10-CM

## 2020-09-30 DIAGNOSIS — F331 Major depressive disorder, recurrent, moderate: Secondary | ICD-10-CM | POA: Insufficient documentation
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
## 2020-10-05 ENCOUNTER — Other Ambulatory Visit (HOSPITAL_COMMUNITY): Payer: Self-pay | Admitting: Psychiatry

## 2020-10-05 DIAGNOSIS — F39 Unspecified mood [affective] disorder: Secondary | ICD-10-CM

## 2020-10-11 ENCOUNTER — Ambulatory Visit
Admission: RE | Admit: 2020-10-11 | Discharge: 2020-10-11 | Disposition: A | Discharge status: Home / Self Care | Payer: Medicare Other | Source: Ambulatory Visit | Attending: Internal Medicine | Admitting: Internal Medicine

## 2020-10-11 DIAGNOSIS — K760 Fatty (change of) liver, not elsewhere classified: Secondary | ICD-10-CM | POA: Diagnosis not present

## 2020-10-11 DIAGNOSIS — R1084 Generalized abdominal pain: Secondary | ICD-10-CM

## 2020-11-08 ENCOUNTER — Other Ambulatory Visit: Payer: Self-pay

## 2020-11-08 ENCOUNTER — Telehealth (HOSPITAL_COMMUNITY): Payer: Medicare Other | Admitting: Physician Assistant

## 2020-11-10 ENCOUNTER — Other Ambulatory Visit: Payer: Self-pay

## 2020-11-10 ENCOUNTER — Ambulatory Visit (INDEPENDENT_AMBULATORY_CARE_PROVIDER_SITE_OTHER): Payer: Medicare Other | Admitting: Clinical

## 2020-11-10 DIAGNOSIS — F331 Major depressive disorder, recurrent, moderate: Secondary | ICD-10-CM | POA: Diagnosis not present

## 2020-11-19 NOTE — Progress Notes (Signed)
   THERAPIST PROGRESS NOTE  Session Time: 25 minutes  Participation Level: Active  Behavioral Response: CasualAlertEuthymic  Type of Therapy: Individual Therapy  Treatment Goals addressed: Coping  Interventions: CBT  Summary:  Nathan Cruz is a 30 y.o. male who presents for the scheduled session oriented times five, appropriately dressed, and friendly. Client denied hallucinations and delusions. Client reported on today he is doing well. Client reported the medication has been very helpful to managing his mood. Client reported he has not had any behavioral outburst since he was last seen. Client reported there has been good and some bad. Client reported he has been able to go see some friends of his recently which was nice for him to get out of the office. Client reported otherwise he has noticed that he gets depressed when he is in the house. Client reported he doesn't drive so majority of the time he is at home playing video games and socializing on the game. Client reported the depression is "situational".    Suicidal/Homicidal: Nowithout intent/plan  Therapist Response:  Therapist began the session asking the client how he has been doing since last seen. Therapist active listened to the clients thoughts and feelings. Therapist used CBT to discuss healthy way to elevate depressed mood. Therapist assigned the client homework to brainstorm and write down what he would like to work on in therapy. Client was scheduled for next appointment.     Plan: Return again in 5 weeks.  Diagnosis: Major depressive disorder, recurrent episode, moderate  Dreden Rivere Y Chavon Lucarelli, LCSW 11/10/2020

## 2020-11-22 ENCOUNTER — Encounter: Payer: Self-pay | Admitting: Gastroenterology

## 2020-11-30 ENCOUNTER — Ambulatory Visit (HOSPITAL_COMMUNITY): Payer: Self-pay | Admitting: Clinical

## 2020-12-05 ENCOUNTER — Encounter (HOSPITAL_COMMUNITY): Payer: Self-pay | Admitting: Physician Assistant

## 2020-12-05 ENCOUNTER — Other Ambulatory Visit: Payer: Self-pay

## 2020-12-05 ENCOUNTER — Ambulatory Visit (INDEPENDENT_AMBULATORY_CARE_PROVIDER_SITE_OTHER): Payer: Medicare Other | Admitting: Physician Assistant

## 2020-12-05 VITALS — BP 125/79 | HR 67 | Ht 69.0 in | Wt 194.0 lb

## 2020-12-05 DIAGNOSIS — R4183 Borderline intellectual functioning: Secondary | ICD-10-CM

## 2020-12-05 DIAGNOSIS — F39 Unspecified mood [affective] disorder: Secondary | ICD-10-CM

## 2020-12-05 MED ORDER — QUETIAPINE FUMARATE 50 MG PO TABS: 50.0000 mg | ORAL_TABLET | Freq: Every day | ORAL | 1 refills | Status: DC
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
## 2020-12-26 ENCOUNTER — Encounter: Payer: Self-pay | Admitting: Gastroenterology

## 2020-12-26 ENCOUNTER — Ambulatory Visit (INDEPENDENT_AMBULATORY_CARE_PROVIDER_SITE_OTHER): Payer: Medicare Other | Admitting: Gastroenterology

## 2020-12-26 ENCOUNTER — Other Ambulatory Visit: Payer: Medicare Other

## 2020-12-26 VITALS — BP 126/80 | HR 76 | Ht 69.0 in | Wt 194.8 lb

## 2020-12-26 DIAGNOSIS — R197 Diarrhea, unspecified: Secondary | ICD-10-CM

## 2020-12-26 DIAGNOSIS — R6889 Other general symptoms and signs: Secondary | ICD-10-CM | POA: Diagnosis not present

## 2020-12-26 DIAGNOSIS — R194 Change in bowel habit: Secondary | ICD-10-CM | POA: Insufficient documentation

## 2020-12-26 DIAGNOSIS — R1084 Generalized abdominal pain: Secondary | ICD-10-CM | POA: Diagnosis not present

## 2020-12-26 MED ORDER — PLENVU 140 G PO SOLR: ORAL | 0 refills | Status: DC

## 2020-12-26 MED ORDER — DICYCLOMINE HCL 10 MG PO CAPS: 10.0000 mg | ORAL_CAPSULE | Freq: Two times a day (BID) | ORAL | 5 refills | Status: DC

## 2020-12-26 NOTE — Patient Instructions (Signed)
If you are age 29 or older, your body mass index should be between 23-30. Your Body mass index is 28.77 kg/m. If this is out of the aforementioned range listed, please consider follow up with your Primary Care Provider.  If you are age 51 or younger, your body mass index should be between 19-25. Your Body mass index is 28.77 kg/m. If this is out of the aformentioned range listed, please consider follow up with your Primary Care Provider.   You have been scheduled for a colonoscopy. Please follow written instructions given to you at your visit today.  Please pick up your prep supplies at the pharmacy within the next 1-3 days. If you use inhalers (even only as needed), please bring them with you on the day of your procedure.  Your provider has requested that you go to the basement level for lab work before leaving today. Press "B" on the elevator. The lab is located at the first door on the left as you exit the elevator.   The [place] [facility] providers would like to encourage you to use [facility] to communicate with providers for non-urgent requests or questions.  Due to long hold times on the telephone, sending your provider a message by [facility] may be a faster and more efficient way to get a response.  Please allow 48 business hours for a response.  Please remember that this is for non-urgent requests.   It was a pleasure to see you today!  Thank you for trusting me with your gastrointestinal care!    Alonza Bogus, PA-C

## 2020-12-26 NOTE — Progress Notes (Signed)
12/26/2020 Nathan Cruz [id number] [birth year]   HISTORY OF PRESENT ILLNESS: This is a 30 year old male who is new to our office.  He has been referred here by Dr. Francesco Sor for evaluation of abdominal pain.  Patient tells me that he has had some stomach issues for years, but they have been worse over the last few months.  He reports having an upset stomach, sometimes to the point that he does not eat for 3 to 4 days at a time.  He denies any nausea or vomiting, however.  Upset stomach refers to stomach pain and discomfort and sometimes diarrhea.  There are times that he has no abdominal pain.  Other times when it is present it moves around.  He says that some nights he cannot stop going to the bathroom.  He does have normal stools at times and occasionally will be constipated.  Sometimes he feels like he has to go and he cannot.  He denies any blood in his stools.  He says that he can not identify certain foods that seem to cause his symptoms.  He says that one night he ate a microwavable dinner and he was fine and the next night he ate the same thing and it really bothered his stomach.  He also mentions about eating cheese steaks, chicken nuggets, etc. No family history of GI illness to his knowledge.  Recent TSH was normal as well as CBC and CMP.   Past Medical History:  Diagnosis Date   ADHD    Diabetes mellitus without complication ([place])    Past Surgical History:  Procedure Laterality Date   NO PAST SURGERIES      reports that he has been smoking. He has never used smokeless tobacco. He reports previous alcohol use. He reports that he does not use drugs. family history includes Cancer - Ovarian in his maternal aunt; Diabetes in his maternal grandmother; Healthy in his father and mother; Hypertension in his maternal grandmother; Irregular heart beat in his maternal grandmother. Allergies  Allergen Reactions   Prednisone Nausea And Vomiting      Outpatient Encounter Medications as of  12/26/2020  Medication Sig   metFORMIN (GLUCOPHAGE) 500 MG tablet Take by mouth 2 (two) times daily with a meal.   polyethylene glycol (MIRALAX MIX-IN PAX) 17 g packet Take 17 g by mouth daily. (Patient taking differently: Take 17 g by mouth daily as needed.)   QUEtiapine (SEROQUEL) 50 MG tablet Take 1 tablet (50 mg total) by mouth at bedtime.   No facility-administered encounter medications on file as of 12/26/2020.     REVIEW OF SYSTEMS  : All other systems reviewed and negative except where noted in the History of Present Illness.   PHYSICAL EXAM: BP 126/80   Pulse 76   Ht 5\' 9"  (1.753 m)   Wt 194 lb 12.8 oz (88.4 kg)   BMI 28.77 kg/m  General: Well developed white male in no acute distress Head: Normocephalic and atraumatic Eyes:  Sclerae anicteric, conjunctiva pink. Ears: Normal auditory acuity Lungs: Clear throughout to auscultation; no W/R/R. Heart: Regular rate and rhythm; no M/R/G. Abdomen: Soft, non-distended.  BS present.  Non-tender. Rectal:  Will be done at the time of colonoscopy. Musculoskeletal: Symmetrical with no gross deformities  Skin: No lesions on visible extremities Extremities: No edema  Neurological: Alert oriented x 4, grossly non-focal Psychological:  Alert and cooperative. Normal mood and affect  ASSESSMENT AND PLAN: *Intermittent generalized abdominal pain and diarrhea:  overall  he reports stomach issues dating back several years, but worse over the last few months.  Generalized abdominal pain that moves around, intermittent severe diarrhea and occasional constipation.  He admits to depression and says that his GI symptoms seem to be worse when he is having more issues with his depression.  Also sounds like he probably does not have the best eating habits/dietary choices.  Ultrasound and basic labs including TSH were unremarkable except fatty liver.  We will check celiac labs.  We will plan for colonoscopy with Dr. Hilarie Fredrickson to rule out Crohn's, colitis,  etc.  We will try Bentyl 10 mg twice daily.  Prescription sent to pharmacy.  The risks, benefits, and alternatives to colonoscopy were discussed with the patient and she consents to proceed.   CC:  Sueanne Margarita, DO

## 2020-12-27 LAB — TISSUE TRANSGLUTAMINASE, IGA: (tTG) Ab, IgA: <1 U/mL

## 2020-12-27 LAB — IGA: Immunoglobulin A: 330 mg/dL — ABNORMAL HIGH (ref 47–310)

## 2020-12-27 NOTE — Progress Notes (Signed)
Addendum: Reviewed and agree with assessment and management plan. Keyli Duross M, MD  

## 2021-01-03 ENCOUNTER — Telehealth: Payer: Self-pay | Admitting: Internal Medicine

## 2021-01-03 NOTE — Telephone Encounter (Signed)
Hey Dr. Hilarie Fredrickson,   Inbound call from patient grandmother. Needs to cancel procedure 7/7 due to high fever. Patient will call back to reschedule.   Thank you.

## 2021-01-04 ENCOUNTER — Encounter: Payer: Medicare Other | Admitting: Internal Medicine

## 2021-01-17 ENCOUNTER — Other Ambulatory Visit: Payer: Self-pay

## 2021-01-17 ENCOUNTER — Ambulatory Visit (INDEPENDENT_AMBULATORY_CARE_PROVIDER_SITE_OTHER): Payer: Medicare Other | Admitting: Clinical

## 2021-01-17 DIAGNOSIS — F331 Major depressive disorder, recurrent, moderate: Secondary | ICD-10-CM | POA: Diagnosis not present

## 2021-01-17 NOTE — Progress Notes (Signed)
   THERAPIST PROGRESS NOTE  Session Time: 17 minutes  Participation Level: Active  Behavioral Response: CasualAlertEuthymic  Type of Therapy: Individual Therapy  Treatment Goals addressed: Coping  Interventions: CBT and Supportive  Summary:  Nathan Cruz is a 30 y.o. male who presents for scheduled appointment oriented x5, appropriately dressed, and friendly.  Client denied hallucinations and delusions. Client reported on today he is doing well.  Client reported since the last appointment he is maintained well with medication compliance.  Client reported he has seen improvement with his mood citing less irritability. Client reported he is unsure if other people notice his progress. Client reported he keeps himself active by going for walks a few days out of the week.  Client reported his appetite is good and he is sleeping well through the night.  Client was prompted in discussion about triggers for anger and his thought processing. Client responded that nothing has been bothering him and he cannot think of anything that needs to be discussed on today.  Client reported he is looking forward to family vacation in late September.  Client reported when he comes back from vacation he plans to look into a program to complete his GED.    Suicidal/Homicidal: Nowithout intent/plan  Therapist Response: Therapist began the session asking the client has been doing since last seen. Therapist used CBT to utilize active listening and positive emotional support towards her thoughts and feelings. Therapist used CBT to attempt discussion about triggers for anger. Therapist used CBT to ask open-ended questions about medication compliance and his perception of changes throughout his duration of treatment thus far. Therapist assigned the client homework to practice self-care and continued physical activity. Client was scheduled for next appointment.    Plan: Return again in 7  weeks.  Diagnosis: Major depressive disorder, recurrent episode, moderate   Nathan Jubilee Kouper Spinella, LCSW 01/17/2021

## 2021-02-08 ENCOUNTER — Other Ambulatory Visit: Payer: Self-pay

## 2021-02-08 ENCOUNTER — Ambulatory Visit (INDEPENDENT_AMBULATORY_CARE_PROVIDER_SITE_OTHER): Payer: Medicare Other | Admitting: Physician Assistant

## 2021-02-08 DIAGNOSIS — F39 Unspecified mood [affective] disorder: Secondary | ICD-10-CM | POA: Diagnosis not present

## 2021-02-08 MED ORDER — QUETIAPINE FUMARATE 50 MG PO TABS: 50.0000 mg | ORAL_TABLET | Freq: Every day | ORAL | 1 refills | Status: DC

## 2021-02-11 ENCOUNTER — Encounter (HOSPITAL_COMMUNITY): Payer: Self-pay | Admitting: Physician Assistant

## 2021-02-11 NOTE — Progress Notes (Addendum)
[facility] MD/PA/NP OP Progress Note  02/08/2021 2:41 PM Nathan Cruz  MRN:  [id number]  Chief Complaint:  Chief Complaint   Medication Management    HPI:   Nathan Cruz is a 30 year old male with a past psychiatric history significant for unspecified mood disorder who presents to [facility] for follow-up and medication management.  Patient is currently being managed on the following medication: Seroquel 50 mg at bedtime.  Patient reports no issues or concerns regarding his current medication regimen.  Patient denies the need for dosage adjustments at this time.  Patient states that he occasionally experiences depressive symptoms such as feeling lonely.  He reports that some weeks are better than others in regards to his mood.  Patient denies experiencing any anxiety.  Patient reports no new stressors at this time.  Patient denies any other issues regarding his mental health.  A PHQ-9 screen was performed with the patient scoring a 4.  Patient is alert and oriented x4, pleasant, calm, cooperative, and fully engaged in conversation during the encounter.  Patient endorses good mood.  Patient denies suicidal or homicidal ideations.  He further denies auditory or visual hallucinations and does not appear to be responding to internal/external stimuli.  Patient endorses good sleep and receives on average 6 to 8 hours of sleep each night.  Patient endorses good appetite and eats on average 3 meals per day.  Patient denies alcohol consumption, tobacco use, and illicit drug use.  He does endorse using delta 8.  Visit Diagnosis:    ICD-10-CM   1. Unspecified mood (affective) disorder (HCC)  F39 QUEtiapine (SEROQUEL) 50 MG tablet      Past Psychiatric History:  History of PTSD, ADHD, ODD.     Has history of 1 prior psychiatric hospitalization at the age of 47 back in 2012 at [place], was diagnosed with ADHD, ODD, PTSD, parent-child problem at that time.   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  The hospitalization occurred subsequent to him using a small knife to inflict lacerations on his left wrist and also having auditory loose Nations.  He had written a suicide note on Facebook at that time.   He has history of being in juvenile detention for skipping school when he was a teenager.  Past Medical History:  Past Medical History:  Diagnosis Date   ADHD    Diabetes mellitus without complication ([place])     Past Surgical History:  Procedure Laterality Date   NO PAST SURGERIES      Family Psychiatric History:  Denies any family psychiatric history  Family History:  Family History  Problem Relation Age of Onset   Healthy Mother    Healthy Father    Cancer - Ovarian Maternal Aunt    Diabetes Maternal Grandmother    Hypertension Maternal Grandmother    Irregular heart beat Maternal Grandmother    Colon polyps Neg Hx    Colon cancer Neg Hx     Social History:  Social History   Socioeconomic History   Marital status: Single    Spouse name: Not on file   Number of children: Not on file   Years of education: Not on file   Highest education level: Not on file  Occupational History   Not on file  Tobacco Use   Smoking status: Some Days   Smokeless tobacco: Never   Tobacco comments:    Smokes Hooka occasionally  Vaping Use   Vaping Use:  Some days  Substance and Sexual Activity   Alcohol use: Not Currently   Drug use: No   Sexual activity: Not on file  Other Topics Concern   Not on file  Social History Narrative   Not on file   Social Determinants of Health   Financial Resource Strain: Not on file  Food Insecurity: Not on file  Transportation Needs: Not on file  Physical Activity: Not on file  Stress: Not on file  Social Connections: Not on file    Allergies:  Allergies  Allergen Reactions   Prednisone Nausea And Vomiting    Metabolic Disorder Labs: Lab Results  Component Value Date    HGBA1C  12/30/2008    5.2 (NOTE) The ADA recommends the following therapeutic goal for glycemic control related to Hgb A1c measurement: Goal of therapy: <6.5 Hgb A1c  Reference: American Diabetes Association: Clinical Practice Recommendations 2010, Diabetes Care, 2010, 33: (Suppl  1).   MPG 103 12/30/2008   No results found for: PROLACTIN Lab Results  Component Value Date   CHOL (H) 12/30/2008    187        ATP III CLASSIFICATION:  <200     mg/dL   Desirable  200-239  mg/dL   Borderline High  >=240    mg/dL   High          TRIG 120 12/30/2008   HDL 36 12/30/2008   CHOLHDL 5.2 12/30/2008   VLDL 24 12/30/2008   LDLCALC (H) 12/30/2008    127        Total Cholesterol/HDL:CHD Risk Coronary Heart Disease Risk Table                     Men   Women  1/2 Average Risk   3.4   3.3  Average Risk       5.0   4.4  2 X Average Risk   9.6   7.1  3 X Average Risk  23.4   11.0        Use the calculated Patient Ratio above and the CHD Risk Table to determine the patient's CHD Risk.        ATP III CLASSIFICATION (LDL):  <100     mg/dL   Optimal  100-129  mg/dL   Near or Above                    Optimal  130-159  mg/dL   Borderline  160-189  mg/dL   High  >190     mg/dL   Very High   Lab Results  Component Value Date   TSH 2.075 Test methodology is 3rd generation TSH 12/29/2008    Therapeutic Level Labs: No results found for: LITHIUM No results found for: VALPROATE No components found for:  CBMZ  Current Medications: Current Outpatient Medications  Medication Sig Dispense Refill   dicyclomine (BENTYL) 10 MG capsule Take 1 capsule (10 mg total) by mouth 2 (two) times daily. 60 capsule 5   metFORMIN (GLUCOPHAGE) 500 MG tablet Take by mouth 2 (two) times daily with a meal.     PEG-KCl-NaCl-NaSulf-Na Asc-C (PLENVU) 140 g SOLR Use as directed. Manufacturer's coupon Universal coupon code:BIN: P2366821; GROUPBK:7291832; PCN: [id number]; IDDA:1967166; PAY NO MORE $50; NO prior  authorization 1 each 0   polyethylene glycol (MIRALAX MIX-IN PAX) 17 g packet Take 17 g by mouth daily. (Patient taking differently: Take 17 g by mouth daily as needed.) 14 each 0  QUEtiapine (SEROQUEL) 50 MG tablet Take 1 tablet (50 mg total) by mouth at bedtime. 30 tablet 1   No current facility-administered medications for this visit.     Musculoskeletal: Strength & Muscle Tone: within normal limits Gait & Station: unable to stand Patient leans: N/A  Psychiatric Specialty Exam: Review of Systems  Psychiatric/Behavioral:  Negative for decreased concentration, dysphoric mood, hallucinations, self-injury, sleep disturbance and suicidal ideas. The patient is not nervous/anxious and is not hyperactive.    Blood pressure 116/85, pulse 80, height '5\' 9"'$  (1.753 m), weight 196 lb (88.9 kg).Body mass index is 28.94 kg/m.  General Appearance: Well Groomed  Eye Contact:  Good  Speech:  Clear and Coherent and Normal Rate  Volume:  Normal  Mood:  Euthymic  Affect:  Appropriate  Thought Process:  Coherent and Descriptions of Associations: Intact  Orientation:  Full (Time, Place, and Person)  Thought Content: WDL   Suicidal Thoughts:  No  Homicidal Thoughts:  No  Memory:  Immediate;   Good Recent;   Fair Remote;   Fair  Judgement:  Fair  Insight:  Fair  Psychomotor Activity:  Normal  Concentration:  Concentration: Good and Attention Span: Good  Recall:  Good  Fund of Knowledge: Fair  Language: Good  Akathisia:  Negative  Handed:  Right  AIMS (if indicated): not done  Assets:  Communication Skills Desire for Improvement Financial Resources/Insurance Housing Social Support Transportation  ADL's:  Intact  Cognition: WNL  Sleep:  Good   Screenings: GAD-7    Flowsheet Row Office Visit from 02/08/2021 in [facility] Office Visit from 12/05/2020 in [facility]  Total GAD-7 Score 0 5      PHQ2-9    [place] Office  Visit from 02/08/2021 in [facility] Office Visit from 12/05/2020 in [place] from 09/13/2020 in [facility]  PHQ-2 Total Score 2 0 2  PHQ-9 Total Score 4 -- 9      [place] Office Visit from 02/08/2021 in [facility] Office Visit from 12/05/2020 in [place] from 09/13/2020 in [place] No Risk No Risk No Risk        Assessment and Plan:   Nathan Cruz is a 30 year old male with a past psychiatric history significant for unspecified mood disorder who presents to [facility] for follow-up and medication management.  Patient reports no issues or concerns regarding his current medication regimen.  Patient denies the need for dosage adjustments at this time and is requesting refills on all his medication following the conclusion of the encounter.  Patient's medication to be e-prescribed to pharmacy of choice.  1. Unspecified mood (affective) disorder (HCC)  - QUEtiapine (SEROQUEL) 50 MG tablet; Take 1 tablet (50 mg total) by mouth at bedtime.  Dispense: 30 tablet; Refill: 1  Patient to follow up in 2 months Writer spent a total of 15 minutes with the patient/reviewing patient's chart  Malachy Mood, PA 02/08/2021, 2:41 PM

## 2021-02-22 ENCOUNTER — Other Ambulatory Visit (HOSPITAL_COMMUNITY): Payer: Self-pay | Admitting: Physician Assistant

## 2021-02-22 DIAGNOSIS — F39 Unspecified mood [affective] disorder: Secondary | ICD-10-CM

## 2021-02-28 NOTE — Telephone Encounter (Signed)
Patient's medication was last filled on 02/08/2021.

## 2021-03-08 DIAGNOSIS — Z23 Encounter for immunization: Secondary | ICD-10-CM | POA: Diagnosis not present

## 2021-03-27 ENCOUNTER — Other Ambulatory Visit: Payer: Self-pay

## 2021-03-27 ENCOUNTER — Ambulatory Visit (INDEPENDENT_AMBULATORY_CARE_PROVIDER_SITE_OTHER): Payer: Medicare Other | Admitting: Clinical

## 2021-03-27 DIAGNOSIS — F39 Unspecified mood [affective] disorder: Secondary | ICD-10-CM | POA: Diagnosis not present

## 2021-03-27 NOTE — Progress Notes (Signed)
   THERAPIST PROGRESS NOTE  Session Time: 16 minutes  Participation Level: Active  Behavioral Response: CasualAlertEuthymic  Type of Therapy: Individual Therapy  Treatment Goals addressed: Coping  Interventions: CBT and Supportive  Summary:  Nathan Cruz is a 30 y.o. male who presents for the scheduled appointment oriented x5, appropriately dressed, and friendly.  Client denied hallucinations and delusions. Client reported on today he continues to do well.  Client reported since she was last seen he and his family went on a vacation to [place].  Client reported he enjoys time with his family.  Client reported he continues to do very well with his current medication regimen.  Client reported his self-care routine remains well.  Client reported his mood has improved significantly.  Client reported he has had no issues since he has started treatment and believes that he has shown positive improvement since this time being here.  Client reported he has no complaints and agrees to attend walk-in therapy appointments as needed in the future if he feels that he has any relapse mental health symptoms.  Client reported he has no other questions or concerns.   Suicidal/Homicidal: Nowithout intent/plan  Therapist Response:  Therapist began the appointment asking the client how he has been doing since last seen. Therapist used CBT utilize active listening and positive emotional support. Therapist used CBT to engage the client to ask open-ended questions about self-care activities, hygienic routine, and mental health symptoms. Therapist used CBT to engage the client to discuss steps to maintain mental health progress with medication management. Therapist used CBT to update the client's treatment plan to goals achieved. Therapist provided the client with information for walk-in hours for his future reference if needed.     Plan: Client will discontinue regularly scheduled therapy  appointment due to achieved progress with medication management and prior appointments with her therapist.  Client agreed to attend walk-in therapy appointments in the future as needed.   Diagnosis: Unspecified mood affective disorder  Birdena Jubilee Leng Montesdeoca, LCSW 03/27/2021

## 2021-04-12 ENCOUNTER — Encounter (HOSPITAL_COMMUNITY): Payer: Self-pay | Admitting: Physician Assistant

## 2021-04-12 ENCOUNTER — Other Ambulatory Visit: Payer: Self-pay

## 2021-04-12 ENCOUNTER — Ambulatory Visit (INDEPENDENT_AMBULATORY_CARE_PROVIDER_SITE_OTHER): Payer: Medicare Other | Admitting: Physician Assistant

## 2021-04-12 VITALS — BP 111/81 | HR 98 | Ht 69.0 in | Wt 180.0 lb

## 2021-04-12 DIAGNOSIS — F39 Unspecified mood [affective] disorder: Secondary | ICD-10-CM | POA: Diagnosis not present

## 2021-04-12 DIAGNOSIS — R4183 Borderline intellectual functioning: Secondary | ICD-10-CM

## 2021-04-12 MED ORDER — QUETIAPINE FUMARATE 50 MG PO TABS: 50.0000 mg | ORAL_TABLET | Freq: Every day | ORAL | 1 refills | Status: DC

## 2021-04-12 NOTE — Progress Notes (Addendum)
[place] MD/PA/NP OP Progress Note  04/12/2021 3:25 PM DEWELL MONNIER  MRN:  [id number]  Chief Complaint:  Chief Complaint   Medication Management    HPI:   Arath L. Polhamus is a 30 year old male with a past psychiatric history significant for unspecified mood disorder and borderline intellectual functioning who presents to [facility] for follow-up and medication management.  Patient is currently being managed on the following medication: Seroquel 50 mg at bedtime.  Patient reports no issues or concerns regarding his current medication.  Patient denies the need for dosage adjustments at this time and is requesting refills on his medications following the conclusion of the encounter.  Patient admits to forgetting to take his medication occasionally but states for the most part he has been consistent with his medication.  Patient denies any depressive episodes at this time.  He further denies anxiety.  Patient denies any new stressors at this time.  Patient is alert and oriented x4, pleasant, calm, cooperative, and fully engaged in conversation during the encounter.  Patient denies suicidal or homicidal ideations.  He further denies auditory or visual hallucinations and does not appear to be responding to internal/external stimuli.  Patient endorses good sleep and receives on average 6 to 7 hours of sleep each night.  Patient endorses good appetite and eats on average 3 meals per day.  Patient denies alcohol consumption, tobacco use, and illicit drug use.  Patient does admit to using delta 8.   Visit Diagnosis:    ICD-10-CM   1. Unspecified mood (affective) disorder (HCC)  F39 QUEtiapine (SEROQUEL) 50 MG tablet      Past Psychiatric History:  History of PTSD, ADHD, ODD.     Has history of 1 prior psychiatric hospitalization at the age of 30 back in 2012 at [place], was diagnosed with ADHD, ODD, PTSD, parent-child problem at that time.  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  The hospitalization occurred subsequent to him using a small knife to inflict lacerations on his left wrist and also having auditory loose Nations.  He had written a suicide note on Facebook at that time.   He has history of being in juvenile detention for skipping school when he was a teenager.  Past Medical History:  Past Medical History:  Diagnosis Date   ADHD    Diabetes mellitus without complication ([place])     Past Surgical History:  Procedure Laterality Date   NO PAST SURGERIES      Family Psychiatric History:  Denies any family psychiatric history.   Family History:  Family History  Problem Relation Age of Onset   Healthy Mother    Healthy Father    Cancer - Ovarian Maternal Aunt    Diabetes Maternal Grandmother    Hypertension Maternal Grandmother    Irregular heart beat Maternal Grandmother    Colon polyps Neg Hx    Colon cancer Neg Hx     Social History:  Social History   Socioeconomic History   Marital status: Single    Spouse name: Not on file   Number of children: Not on file   Years of education: Not on file   Highest education level: Not on file  Occupational History   Not on file  Tobacco Use   Smoking status: Some Days   Smokeless tobacco: Never   Tobacco comments:    Smokes Hooka occasionally  Vaping Use   Vaping Use: Some days  Substance and  Sexual Activity   Alcohol use: Not Currently   Drug use: No   Sexual activity: Not on file  Other Topics Concern   Not on file  Social History Narrative   Not on file   Social Determinants of Health   Financial Resource Strain: Not on file  Food Insecurity: Not on file  Transportation Needs: Not on file  Physical Activity: Not on file  Stress: Not on file  Social Connections: Not on file    Allergies:  Allergies  Allergen Reactions   Prednisone Nausea And Vomiting    Metabolic Disorder Labs: Lab Results  Component Value Date   HGBA1C   12/30/2008    5.2 (NOTE) The ADA recommends the following therapeutic goal for glycemic control related to Hgb A1c measurement: Goal of therapy: <6.5 Hgb A1c  Reference: American Diabetes Association: Clinical Practice Recommendations 2010, Diabetes Care, 2010, 33: (Suppl  1).   MPG 103 12/30/2008   No results found for: PROLACTIN Lab Results  Component Value Date   CHOL (H) 12/30/2008    187        ATP III CLASSIFICATION:  <200     mg/dL   Desirable  200-239  mg/dL   Borderline High  >=240    mg/dL   High          TRIG 120 12/30/2008   HDL 36 12/30/2008   CHOLHDL 5.2 12/30/2008   VLDL 24 12/30/2008   LDLCALC (H) 12/30/2008    127        Total Cholesterol/HDL:CHD Risk Coronary Heart Disease Risk Table                     Men   Women  1/2 Average Risk   3.4   3.3  Average Risk       5.0   4.4  2 X Average Risk   9.6   7.1  3 X Average Risk  23.4   11.0        Use the calculated Patient Ratio above and the CHD Risk Table to determine the patient's CHD Risk.        ATP III CLASSIFICATION (LDL):  <100     mg/dL   Optimal  100-129  mg/dL   Near or Above                    Optimal  130-159  mg/dL   Borderline  160-189  mg/dL   High  >190     mg/dL   Very High   Lab Results  Component Value Date   TSH 2.075 Test methodology is 3rd generation TSH 12/29/2008    Therapeutic Level Labs: No results found for: LITHIUM No results found for: VALPROATE No components found for:  CBMZ  Current Medications: Current Outpatient Medications  Medication Sig Dispense Refill   dicyclomine (BENTYL) 10 MG capsule Take 1 capsule (10 mg total) by mouth 2 (two) times daily. 60 capsule 5   metFORMIN (GLUCOPHAGE) 500 MG tablet Take by mouth 2 (two) times daily with a meal.     PEG-KCl-NaCl-NaSulf-Na Asc-C (PLENVU) 140 g SOLR Use as directed. Manufacturer's coupon Universal coupon code:BIN: P2366821; GROUP: [id number]; PCN: [id number]; ID: [id number]; PAY NO MORE $50; NO prior authorization 1 each 0    polyethylene glycol (MIRALAX MIX-IN PAX) 17 g packet Take 17 g by mouth daily. (Patient taking differently: Take 17 g by mouth daily as needed.) 14 each 0   QUEtiapine (SEROQUEL) 50  MG tablet Take 1 tablet (50 mg total) by mouth at bedtime. 30 tablet 1   No current facility-administered medications for this visit.     Musculoskeletal: Strength & Muscle Tone: within normal limits Gait & Station: normal Patient leans: N/A  Psychiatric Specialty Exam: Review of Systems  Psychiatric/Behavioral:  Negative for decreased concentration, dysphoric mood, hallucinations, self-injury, sleep disturbance and suicidal ideas. The patient is not nervous/anxious and is not hyperactive.    Blood pressure 111/81, pulse 98, height 5\' 9"  (1.753 m), weight 180 lb (81.6 kg).Body mass index is 26.58 kg/m.  General Appearance: Well Groomed  Eye Contact:  Good  Speech:  Clear and Coherent and Normal Rate  Volume:  Normal  Mood:  Euthymic  Affect:  Appropriate  Thought Process:  Coherent and Descriptions of Associations: Intact  Orientation:  Full (Time, Place, and Person)  Thought Content: WDL   Suicidal Thoughts:  No  Homicidal Thoughts:  No  Memory:  Immediate;   Good Recent;   Good Remote;   Fair  Judgement:  Good  Insight:  Fair  Psychomotor Activity:  Normal  Concentration:  Concentration: Good and Attention Span: Good  Recall:  Good  Fund of Knowledge: Fair  Language: Good  Akathisia:  Negative  Handed:  Right  AIMS (if indicated): not done  Assets:  Communication Skills Desire for Improvement Financial Resources/Insurance Housing Social Support Transportation  ADL's:  Intact  Cognition: WNL  Sleep:  Good   Screenings: GAD-7    Flowsheet Row Clinical Support from 04/12/2021 in [facility] Office Visit from 02/08/2021 in [facility] Office Visit from 12/05/2020 in [facility]  Total GAD-7 Score 1 0  5      PHQ2-9    [place] from 04/12/2021 in [facility] Office Visit from 02/08/2021 in [facility] Office Visit from 12/05/2020 in [place] from 09/13/2020 in [facility]  PHQ-2 Total Score 0 2 0 2  PHQ-9 Total Score -- 4 -- 9      Flowsheet Row Clinical Support from 04/12/2021 in [facility] Office Visit from 02/08/2021 in [facility] Office Visit from 12/05/2020 in [place] No Risk No Risk No Risk        Assessment and Plan:   Harshaan L. Remmert is a 30 year old male with a past psychiatric history significant for unspecified mood disorder and borderline intellectual functioning who presents to [facility] for follow-up and medication management.  Patient reports no issues or concerns regarding his current medication regimen.  Patient denies the need for dosage adjustments at this time and is requesting refills on his medication.  Patient's medication to be e-prescribed to pharmacy of choice.  1. Unspecified mood (affective) disorder (HCC)  - QUEtiapine (SEROQUEL) 50 MG tablet; Take 1 tablet (50 mg total) by mouth at bedtime.  Dispense: 30 tablet; Refill: 1  Patient to follow up in 2 months Provider spent a total of 14 minutes with the patient/reviewing patient's chart.  Malachy Mood, PA 04/12/2021, 3:25 PM

## 2021-06-14 ENCOUNTER — Ambulatory Visit (INDEPENDENT_AMBULATORY_CARE_PROVIDER_SITE_OTHER): Payer: Medicare Other | Admitting: Physician Assistant

## 2021-06-14 ENCOUNTER — Encounter (HOSPITAL_COMMUNITY): Payer: Self-pay | Admitting: Physician Assistant

## 2021-06-14 ENCOUNTER — Other Ambulatory Visit: Payer: Self-pay

## 2021-06-14 DIAGNOSIS — F39 Unspecified mood [affective] disorder: Secondary | ICD-10-CM

## 2021-06-14 MED ORDER — QUETIAPINE FUMARATE 50 MG PO TABS: 50.0000 mg | ORAL_TABLET | Freq: Every day | ORAL | 2 refills | Status: DC

## 2021-06-14 NOTE — Progress Notes (Signed)
[place] MD/PA/NP OP Progress Note  06/14/2021 3:20 PM Nathan Cruz  MRN:  [id number]  Chief Complaint:  Chief Complaint   Medication Management    HPI:   Nathan Cruz is a 30 year old male with a past psychiatric history significant for unspecified mood disorder and borderline intellectual functioning who presents to [facility] for follow-up and medication management.  Patient is currently being managed on the following medication: Seroquel 50 mg at bedtime.  Patient reports no issues or concerns regarding his current medication.  Patient denies the need for dosage adjustments at this time and is requesting refills on his medications following the conclusion of the encounter.  Patient denies experiencing any depression and further denies anxiety.  Patient denies any new stressors at this time.  A GAD-7 screen was performed with the patient scoring a 2.  Patient is alert and oriented x4, pleasant, calm, cooperative, and fully engaged in conversation during the encounter.  Patient endorses feeling a little tired but states that his mood has been overall good.  Patient denies suicidal or homicidal ideations.  He further denies auditory or visual hallucinations and does not appear to be responding to internal/external stimuli.  Patient endorses normal sleep.  Patient endorses good appetite and eats on average 3 square meals a day.  He reports snacking a lot.  Patient denies alcohol consumption, tobacco use, and illicit drug use.  Patient does admit to taking delta 8.  Visit Diagnosis:    ICD-10-CM   1. Unspecified mood (affective) disorder (HCC)  F39 QUEtiapine (SEROQUEL) 50 MG tablet      Past Psychiatric History:  History of PTSD, ADHD, ODD.     Has history of 1 prior psychiatric hospitalization at the age of 2 back in 2012 at [place], was diagnosed with ADHD, ODD, PTSD, parent-child problem at that time.  There was also concern for borderline  intellectual functioning.  The hospitalization occurred subsequent to him using a small knife to inflict lacerations on his left wrist and also having auditory loose Nations.  He had written a suicide note on Facebook at that time.   He has history of being in juvenile detention for skipping school when he was a teenager  Past Medical History:  Past Medical History:  Diagnosis Date   ADHD    Diabetes mellitus without complication ([place])     Past Surgical History:  Procedure Laterality Date   NO PAST SURGERIES      Family Psychiatric History:  Denies any family psychiatric history.   Family History:  Family History  Problem Relation Age of Onset   Healthy Mother    Healthy Father    Cancer - Ovarian Maternal Aunt    Diabetes Maternal Grandmother    Hypertension Maternal Grandmother    Irregular heart beat Maternal Grandmother    Colon polyps Neg Hx    Colon cancer Neg Hx     Social History:  Social History   Socioeconomic History   Marital status: Single    Spouse name: Not on file   Number of children: Not on file   Years of education: Not on file   Highest education level: Not on file  Occupational History   Not on file  Tobacco Use   Smoking status: Some Days   Smokeless tobacco: Never   Tobacco comments:    Smokes Hooka occasionally  Vaping Use   Vaping Use: Some days  Substance and Sexual Activity   Alcohol  use: Not Currently   Drug use: No   Sexual activity: Not on file  Other Topics Concern   Not on file  Social History Narrative   Not on file   Social Determinants of Health   Financial Resource Strain: Not on file  Food Insecurity: Not on file  Transportation Needs: Not on file  Physical Activity: Not on file  Stress: Not on file  Social Connections: Not on file    Allergies:  Allergies  Allergen Reactions   Prednisone Nausea And Vomiting    Metabolic Disorder Labs: Lab Results  Component Value Date   HGBA1C  12/30/2008     5.2 (NOTE) The ADA recommends the following therapeutic goal for glycemic control related to Hgb A1c measurement: Goal of therapy: <6.5 Hgb A1c  Reference: American Diabetes Association: Clinical Practice Recommendations 2010, Diabetes Care, 2010, 33: (Suppl  1).   MPG 103 12/30/2008   No results found for: PROLACTIN Lab Results  Component Value Date   CHOL (H) 12/30/2008    187        ATP III CLASSIFICATION:  <200     mg/dL   Desirable  200-239  mg/dL   Borderline High  >=240    mg/dL   High          TRIG 120 12/30/2008   HDL 36 12/30/2008   CHOLHDL 5.2 12/30/2008   VLDL 24 12/30/2008   LDLCALC (H) 12/30/2008    127        Total Cholesterol/HDL:CHD Risk Coronary Heart Disease Risk Table                     Men   Women  1/2 Average Risk   3.4   3.3  Average Risk       5.0   4.4  2 X Average Risk   9.6   7.1  3 X Average Risk  23.4   11.0        Use the calculated Patient Ratio above and the CHD Risk Table to determine the patient's CHD Risk.        ATP III CLASSIFICATION (LDL):  <100     mg/dL   Optimal  100-129  mg/dL   Near or Above                    Optimal  130-159  mg/dL   Borderline  160-189  mg/dL   High  >190     mg/dL   Very High   Lab Results  Component Value Date   TSH 2.075 Test methodology is 3rd generation TSH 12/29/2008    Therapeutic Level Labs: No results found for: LITHIUM No results found for: VALPROATE No components found for:  CBMZ  Current Medications: Current Outpatient Medications  Medication Sig Dispense Refill   dicyclomine (BENTYL) 10 MG capsule Take 1 capsule (10 mg total) by mouth 2 (two) times daily. 60 capsule 5   metFORMIN (GLUCOPHAGE) 500 MG tablet Take by mouth 2 (two) times daily with a meal.     PEG-KCl-NaCl-NaSulf-Na Asc-C (PLENVU) 140 g SOLR Use as directed. Manufacturer's coupon Universal coupon code:BIN: P2366821; GROUP: [id number]; PCN: [id number]; ID: [id number]; PAY NO MORE $50; NO prior authorization 1 each 0    polyethylene glycol (MIRALAX MIX-IN PAX) 17 g packet Take 17 g by mouth daily. (Patient taking differently: Take 17 g by mouth daily as needed.) 14 each 0   QUEtiapine (SEROQUEL) 50 MG tablet Take 1 tablet (  50 mg total) by mouth at bedtime. 30 tablet 2   No current facility-administered medications for this visit.     Musculoskeletal: Strength & Muscle Tone: within normal limits Gait & Station: normal Patient leans: N/A  Psychiatric Specialty Exam: Review of Systems  Blood pressure 112/79, pulse 65, height 5\' 9"  (1.753 m), weight 198 lb (89.8 kg), SpO2 100 %.Body mass index is 29.24 kg/m.  General Appearance: Well Groomed  Eye Contact:  Good  Speech:  Clear and Coherent and Normal Rate  Volume:  Normal  Mood:  Euthymic  Affect:  Appropriate  Thought Process:  Coherent and Descriptions of Associations: Intact  Orientation:  Full (Time, Place, and Person)  Thought Content: WDL   Suicidal Thoughts:  No  Homicidal Thoughts:  No  Memory:  Immediate;   Good Recent;   Good Remote;   Fair  Judgement:  Good  Insight:  Good  Psychomotor Activity:  Normal  Concentration:  Concentration: Good and Attention Span: Good  Recall:  Good  Fund of Knowledge: Fair  Language: Good  Akathisia:  Negative  Handed:  Right  AIMS (if indicated): not done  Assets:  Communication Skills Desire for Improvement Financial Resources/Insurance Housing Social Support Transportation  ADL's:  Intact  Cognition: WNL  Sleep:  Good   Screenings: GAD-7    Flowsheet Row Office Visit from 06/14/2021 in [facility] Office Visit from 04/12/2021 in [facility] Office Visit from 02/08/2021 in [facility] Office Visit from 12/05/2020 in [facility]  Total GAD-7 Score 2 1 0 5      PHQ2-9    [place] Office Visit from 06/14/2021 in [facility] Office Visit from  04/12/2021 in [facility] Office Visit from 02/08/2021 in [facility] Office Visit from 12/05/2020 in [place] from 09/13/2020 in [facility]  PHQ-2 Total Score 0 0 2 0 2  PHQ-9 Total Score -- -- 4 -- 9      [place] Office Visit from 06/14/2021 in [facility] Office Visit from 04/12/2021 in [facility] Office Visit from 02/08/2021 in [place] No Risk No Risk No Risk        Assessment and Plan:   Christepher L. Spillers is a 30 year old male with a past psychiatric history significant for unspecified mood disorder and borderline intellectual functioning who presents to [facility] for follow-up and medication management.  He reports no issues or concerns regarding his current medication.  Patient denies the need for dosage adjustments at this time and is requesting refills on his medication.  Patient's medication to be e-prescribed to pharmacy of choice.  1. Unspecified mood (affective) disorder (HCC)  - QUEtiapine (SEROQUEL) 50 MG tablet; Take 1 tablet (50 mg total) by mouth at bedtime.  Dispense: 30 tablet; Refill: 2  Patient to follow up in 3 months Provider spent a total of 8 minutes with the patient/reviewing the patient's chart  Malachy Mood, PA 06/14/2021, 3:20 PM

## 2021-06-15 DIAGNOSIS — E663 Overweight: Secondary | ICD-10-CM | POA: Diagnosis not present

## 2021-06-15 DIAGNOSIS — Z7189 Other specified counseling: Secondary | ICD-10-CM | POA: Diagnosis not present

## 2021-06-15 DIAGNOSIS — R109 Unspecified abdominal pain: Secondary | ICD-10-CM | POA: Diagnosis not present

## 2021-06-15 DIAGNOSIS — R519 Headache, unspecified: Secondary | ICD-10-CM | POA: Diagnosis not present

## 2021-06-15 DIAGNOSIS — E1169 Type 2 diabetes mellitus with other specified complication: Secondary | ICD-10-CM | POA: Diagnosis not present

## 2021-08-28 ENCOUNTER — Ambulatory Visit: Payer: Medicare Other | Admitting: Gastroenterology

## 2021-09-13 ENCOUNTER — Encounter (HOSPITAL_COMMUNITY): Payer: Self-pay | Admitting: Physician Assistant

## 2021-09-13 ENCOUNTER — Other Ambulatory Visit: Payer: Self-pay

## 2021-09-13 ENCOUNTER — Ambulatory Visit (INDEPENDENT_AMBULATORY_CARE_PROVIDER_SITE_OTHER): Payer: Medicare Other | Admitting: Physician Assistant

## 2021-09-13 VITALS — BP 117/75 | HR 78 | Ht 69.0 in | Wt 190.0 lb

## 2021-09-13 DIAGNOSIS — F331 Major depressive disorder, recurrent, moderate: Secondary | ICD-10-CM

## 2021-09-13 DIAGNOSIS — F39 Unspecified mood [affective] disorder: Secondary | ICD-10-CM | POA: Diagnosis not present

## 2021-09-13 MED ORDER — QUETIAPINE FUMARATE 50 MG PO TABS: 50.0000 mg | ORAL_TABLET | Freq: Every day | ORAL | 2 refills | Status: DC

## 2021-09-13 MED ORDER — FLUOXETINE HCL 10 MG PO CAPS: 10.0000 mg | ORAL_CAPSULE | Freq: Every day | ORAL | 2 refills | Status: DC

## 2021-09-13 NOTE — Progress Notes (Addendum)
BH MD/PA/NP OP Progress Note ? ?09/13/2021 3:03 PM ?Nathan Cruz  ?MRN:  [id number] ? ?Chief Complaint:  ?Chief Complaint  ?Patient presents with  ? Medication Management  ? ?HPI:  ? ?Nathan Cruz is a 31 year old male with a past psychiatric history significant for unspecified mood disorder, major depressive disorder, and borderline intellectual functioning who presents to [facility] for follow-up and medication management.  Patient is currently being managed on the following medication: Seroquel 50 mg at bedtime. ? ?Patient reports that he is doing okay today; however, he reports that some days are worse than others.  He reports that the last few weeks have been rough on him due to going through a break-up.  Patient endorses depressive episodes roughly 2 to 3 days/week.  Patient endorses the following symptoms: low mood and feelings of loneliness.  Patient denies lack of motivation and states that his depressive episodes have not affected his ability to get tasks done.  Patient does report that he notices getting agitated more easily.  Patient denies anxiety.  Patient's main stressor involves schoolwork and states that he is currently working on getting his GED. ? ?Patient is alert and oriented x4, calm, cooperative, and fully engaged in conversation during the encounter.  Patient endorses good mood.  Patient denies suicidal or homicidal ideations.  He further denies auditory or visual hallucinations and does not appear to be responding to internal/external stimuli.  Patient endorses fair sleep and receives on average 5 to 7 hours of sleep each night.  Patient endorses decreased appetite and attributes his lack of appetite to issues with his stomach.  Patient denies alcohol use, tobacco use, and illicit drug use.  Patient does endorse using delta 8 products. ? ?Visit Diagnosis:  ?  ICD-10-CM   ?1. Unspecified mood (affective) disorder (HCC)  F39 QUEtiapine (SEROQUEL)  50 MG tablet  ?  FLUoxetine (PROZAC) 10 MG capsule  ?  ? ? ?Past Psychiatric History:  ?History of PTSD, ADHD, ODD.   ?  ?Has history of 1 prior psychiatric hospitalization at the age of 2 back in 2012 at [place], was diagnosed with ADHD, ODD, PTSD, parent-child problem at that time.  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  The hospitalization occurred subsequent to him using a small knife to inflict lacerations on his left wrist and also having auditory loose Nations.  He had written a suicide note on Facebook at that time. ?  ?He has history of being in juvenile detention for skipping school when he was a teenager ? ?Past Medical History:  ?Past Medical History:  ?Diagnosis Date  ? ADHD   ? Diabetes mellitus without complication ([place])   ?  ?Past Surgical History:  ?Procedure Laterality Date  ? NO PAST SURGERIES    ? ? ?Family Psychiatric History:  ?Denies any family psychiatric history.  ? ?Family History:  ?Family History  ?Problem Relation Age of Onset  ? Healthy Mother   ? Healthy Father   ? Cancer - Ovarian Maternal Aunt   ? Diabetes Maternal Grandmother   ? Hypertension Maternal Grandmother   ? Irregular heart beat Maternal Grandmother   ? Colon polyps Neg Hx   ? Colon cancer Neg Hx   ? ? ?Social History:  ?Social History  ? ?Socioeconomic History  ? Marital status: Single  ?  Spouse name: Not on file  ? Number of children: Not on file  ? Years of education: Not on file  ?  Highest education level: Not on file  ?Occupational History  ? Not on file  ?Tobacco Use  ? Smoking status: Some Days  ? Smokeless tobacco: Never  ? Tobacco comments:  ?  Smokes [place] occasionally  ?Vaping Use  ? Vaping Use: Some days  ?Substance and Sexual Activity  ? Alcohol use: Not Currently  ? Drug use: No  ? Sexual activity: Not on file  ?Other Topics Concern  ? Not on file  ?Social History Narrative  ? Not on file  ? ?Social Determinants of Health  ? ?Financial Resource Strain: Not on file  ?Food  Insecurity: Not on file  ?Transportation Needs: Not on file  ?Physical Activity: Not on file  ?Stress: Not on file  ?Social Connections: Not on file  ? ? ?Allergies:  ?Allergies  ?Allergen Reactions  ? Prednisone Nausea And Vomiting  ? ? ?Metabolic Disorder Labs: ?Lab Results  ?Component Value Date  ? HGBA1C  12/30/2008  ?  5.2 ?(NOTE) The ADA recommends the following therapeutic goal for glycemic control related to Hgb A1c measurement: Goal of therapy: <6.5 Hgb A1c  Reference: American Diabetes Association: Clinical Practice Recommendations 2010, Diabetes Care, 2010, 33: (Suppl ? 1).  ? MPG 103 12/30/2008  ? ?No results found for: PROLACTIN ?Lab Results  ?Component Value Date  ? CHOL (H) 12/30/2008  ?  187        ?ATP III CLASSIFICATION: ? <200     mg/dL   Desirable ? 200-239  mg/dL   Borderline High ? >=240    mg/dL   High ?        ? TRIG 120 12/30/2008  ? HDL 36 12/30/2008  ? CHOLHDL 5.2 12/30/2008  ? VLDL 24 12/30/2008  ? [place] (H) 12/30/2008  ?  127        ?Total Cholesterol/HDL:CHD Risk ?Coronary Heart Disease Risk Table ?                    Men   Women ? 1/2 Average Risk   3.4   3.3 ? Average Risk       5.0   4.4 ? 2 X Average Risk   9.6   7.1 ? 3 X Average Risk  23.4   11.0 ?       ?Use the calculated Patient Ratio ?above and the CHD Risk Table ?to determine the patient's CHD Risk. ?       ?ATP III CLASSIFICATION (LDL): ? <100     mg/dL   Optimal ? 100-129  mg/dL   Near or Above ?                   Optimal ? 130-159  mg/dL   Borderline ? 160-189  mg/dL   High ? >190     mg/dL   Very High  ? ?Lab Results  ?Component Value Date  ? TSH 2.075 Test methodology is 3rd generation TSH 12/29/2008  ? ? ?Therapeutic Level Labs: ?No results found for: LITHIUM ?No results found for: VALPROATE ?No components found for:  CBMZ ? ?Current Medications: ?Current Outpatient Medications  ?Medication Sig Dispense Refill  ? FLUoxetine (PROZAC) 10 MG capsule Take 1 capsule (10 mg total) by mouth daily. 30 capsule 2  ? dicyclomine  (BENTYL) 10 MG capsule Take 1 capsule (10 mg total) by mouth 2 (two) times daily. 60 capsule 5  ? metFORMIN (GLUCOPHAGE) 500 MG tablet Take by mouth 2 (two) times daily with a meal.    ?  PEG-KCl-NaCl-NaSulf-Na Asc-C (PLENVU) 140 g SOLR Use as directed. Manufacturer's coupon Universal coupon code:BIN: P2366821; GROUP: [id number]; PCN: [id number]; ID: [id number]; PAY NO MORE $50; NO prior authorization 1 each 0  ? polyethylene glycol (MIRALAX MIX-IN PAX) 17 g packet Take 17 g by mouth daily. (Patient taking differently: Take 17 g by mouth daily as needed.) 14 each 0  ? QUEtiapine (SEROQUEL) 50 MG tablet Take 1 tablet (50 mg total) by mouth at bedtime. 30 tablet 2  ? ?No current facility-administered medications for this visit.  ? ? ? ?Musculoskeletal: ?Strength & Muscle Tone: within normal limits ?Gait & Station: normal ?Patient leans: N/A ? ?Psychiatric Specialty Exam: ?Review of Systems  ?Psychiatric/Behavioral:  Positive for sleep disturbance. Negative for decreased concentration, dysphoric mood, hallucinations, self-injury and suicidal ideas. The patient is not nervous/anxious and is not hyperactive.    ?Blood pressure 117/75, pulse 78, height '5\' 9"'$  (1.753 m), weight 190 lb (86.2 kg), SpO2 100 %.Body mass index is 28.06 kg/m?.  ?General Appearance: Casual  ?Eye Contact:  Good  ?Speech:  Clear and Coherent and Normal Rate  ?Volume:  Normal  ?Mood:  Euthymic  ?Affect:  Appropriate  ?Thought Process:  Coherent, Goal Directed, and Descriptions of Associations: Intact  ?Orientation:  Full (Time, Place, and Person)  ?Thought Content: WDL   ?Suicidal Thoughts:  No  ?Homicidal Thoughts:  No  ?Memory:  Immediate;   Good ?Recent;   Good ?Remote;   Fair  ?Judgement:  Good  ?Insight:  Good  ?Psychomotor Activity:  Normal  ?Concentration:  Concentration: Good and Attention Span: Good  ?Recall:  Good  ?Fund of Knowledge: Fair  ?Language: Good  ?Akathisia:  No  ?Handed:  Right  ?AIMS (if indicated): not done  ?Assets:  Communication  Skills ?Desire for Improvement ?Financial Resources/Insurance ?Housing ?Social Support ?Transportation  ?ADL's:  Intact  ?Cognition: WNL  ?Sleep:  Fair  ? ?Screenings: ?GAD-7   ? ?[place] Office Visit fr

## 2021-10-02 DIAGNOSIS — E1169 Type 2 diabetes mellitus with other specified complication: Secondary | ICD-10-CM | POA: Diagnosis not present

## 2021-10-02 DIAGNOSIS — E781 Pure hyperglyceridemia: Secondary | ICD-10-CM | POA: Diagnosis not present

## 2021-10-02 DIAGNOSIS — R7989 Other specified abnormal findings of blood chemistry: Secondary | ICD-10-CM | POA: Diagnosis not present

## 2021-10-09 DIAGNOSIS — E781 Pure hyperglyceridemia: Secondary | ICD-10-CM | POA: Diagnosis not present

## 2021-10-09 DIAGNOSIS — Z1331 Encounter for screening for depression: Secondary | ICD-10-CM | POA: Diagnosis not present

## 2021-10-09 DIAGNOSIS — E663 Overweight: Secondary | ICD-10-CM | POA: Diagnosis not present

## 2021-10-09 DIAGNOSIS — Z1339 Encounter for screening examination for other mental health and behavioral disorders: Secondary | ICD-10-CM | POA: Diagnosis not present

## 2021-10-09 DIAGNOSIS — E46 Unspecified protein-calorie malnutrition: Secondary | ICD-10-CM | POA: Diagnosis not present

## 2021-10-09 DIAGNOSIS — R109 Unspecified abdominal pain: Secondary | ICD-10-CM | POA: Diagnosis not present

## 2021-10-09 DIAGNOSIS — Z0000 Encounter for general adult medical examination without abnormal findings: Secondary | ICD-10-CM | POA: Diagnosis not present

## 2021-10-09 DIAGNOSIS — E1169 Type 2 diabetes mellitus with other specified complication: Secondary | ICD-10-CM | POA: Diagnosis not present

## 2021-10-09 DIAGNOSIS — H6120 Impacted cerumen, unspecified ear: Secondary | ICD-10-CM | POA: Diagnosis not present

## 2021-10-16 ENCOUNTER — Other Ambulatory Visit (HOSPITAL_COMMUNITY): Payer: Self-pay | Admitting: Physician Assistant

## 2021-10-16 DIAGNOSIS — F39 Unspecified mood [affective] disorder: Secondary | ICD-10-CM

## 2021-10-16 DIAGNOSIS — F331 Major depressive disorder, recurrent, moderate: Secondary | ICD-10-CM

## 2021-11-15 ENCOUNTER — Encounter (HOSPITAL_COMMUNITY): Payer: Medicare Other | Admitting: Physician Assistant

## 2021-12-07 ENCOUNTER — Telehealth (INDEPENDENT_AMBULATORY_CARE_PROVIDER_SITE_OTHER): Payer: Medicare Other | Admitting: Physician Assistant

## 2021-12-07 DIAGNOSIS — F39 Unspecified mood [affective] disorder: Secondary | ICD-10-CM | POA: Diagnosis not present

## 2021-12-07 DIAGNOSIS — F331 Major depressive disorder, recurrent, moderate: Secondary | ICD-10-CM

## 2021-12-10 NOTE — Progress Notes (Addendum)
BH MD/PA/NP OP Progress Note  Virtual Visit via Video Note  I connected with Nathan Cruz on 12/10/21 at  3:00 PM EDT by a video enabled telemedicine application and verified that I am speaking with the correct person using two identifiers.  Location: Patient: Home Provider: Clinic   I discussed the limitations of evaluation and management by telemedicine and the availability of in person appointments. The patient expressed understanding and agreed to proceed.  Follow Up Instructions:   I discussed the assessment and treatment plan with the patient. The patient was provided an opportunity to ask questions and all were answered. The patient agreed with the plan and demonstrated an understanding of the instructions.   The patient was advised to call back or seek an in-person evaluation if the symptoms worsen or if the condition fails to improve as anticipated.  I provided 11 minutes of non-face-to-face time during this encounter.  Malachy Mood, PA  12/10/2021 11:52 PM Nathan Cruz  MRN:  [id number]  Chief Complaint:  No chief complaint on file.  HPI:   Nathan Cruz is a 31 year old male with a past psychiatric history significant for unspecified mood disorder, major depressive disorder, and borderline intellectual functioning who presents to [facility] via virtual video visit for follow-up and medication management.  Patient is currently being managed on the following medication:   Seroquel 50 mg at bedtime. Fluoxetine 10 mg daily  Patient reports that his Seroquel continues to be helpful in the management of his sleep.  Patient denies experiencing any depressive symptoms at this time.  Patient further denies anxiety.  Patient denies any new stressors at this time and states that he is trying to work his way through school.  Patient is alert and oriented x4, calm, cooperative, and fully engaged in conversation during the  encounter.  Patient endorses good mood.  Patient denies suicidal or homicidal ideations.  He further denies auditory or visual hallucinations and does not appear to be responding to internal/external stimuli.  Patient endorses fair sleep and receives on average 6 to 7 hours of sleep each night.  Patient endorses decreased appetite and attributes his lack of appetite to issues with his stomach.  Patient patient endorses using alcohol sparingly.  Patient denies tobacco use.  Patient further denies illicit drug use but states that he does engage in the use of delta 8.  Visit Diagnosis:  No diagnosis found.   Past Psychiatric History:  History of PTSD, ADHD, ODD.     Has history of 1 prior psychiatric hospitalization at the age of 48 back in 2012 at [place], was diagnosed with ADHD, ODD, PTSD, parent-child problem at that time.  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  The hospitalization occurred subsequent to him using a small knife to inflict lacerations on his left wrist and also having auditory loose [place].  He had written a suicide note on Facebook at that time.   He has history of being in juvenile detention for skipping school when he was a teenager  Past Medical History:  Past Medical History:  Diagnosis Date   ADHD    Diabetes mellitus without complication ([place])     Past Surgical History:  Procedure Laterality Date   NO PAST SURGERIES      Family Psychiatric History:  Denies any family psychiatric history.   Family History:  Family History  Problem Relation Age of Onset   Healthy Mother  Healthy Father    Cancer - Ovarian Maternal Aunt    Diabetes Maternal Grandmother    Hypertension Maternal Grandmother    Irregular heart beat Maternal Grandmother    Colon polyps Neg Hx    Colon cancer Neg Hx     Social History:  Social History   Socioeconomic History   Marital status: Single    Spouse name: Not on file   Number of children: Not on  file   Years of education: Not on file   Highest education level: Not on file  Occupational History   Not on file  Tobacco Use   Smoking status: Some Days   Smokeless tobacco: Never   Tobacco comments:    [place] occasionally  Vaping Use   Vaping Use: Some days  Substance and Sexual Activity   Alcohol use: Not Currently   Drug use: No   Sexual activity: Not on file  Other Topics Concern   Not on file  Social History Narrative   Not on file   Social Determinants of Health   Financial Resource Strain: Not on file  Food Insecurity: Not on file  Transportation Needs: Not on file  Physical Activity: Not on file  Stress: Not on file  Social Connections: Not on file    Allergies:  Allergies  Allergen Reactions   Prednisone Nausea And Vomiting    Metabolic Disorder Labs: Lab Results  Component Value Date   HGBA1C  12/30/2008    5.2 (NOTE) The ADA recommends the following therapeutic goal for glycemic control related to Hgb A1c measurement: Goal of therapy: <6.5 Hgb A1c  Reference: American Diabetes Association: Clinical Practice Recommendations 2010, Diabetes Care, 2010, 33: (Suppl  1).   MPG 103 12/30/2008   No results found for: "PROLACTIN" Lab Results  Component Value Date   CHOL (H) 12/30/2008    187        ATP III CLASSIFICATION:  <200     mg/dL   Desirable  200-239  mg/dL   Borderline High  >=240    mg/dL   High          TRIG 120 12/30/2008   HDL 36 12/30/2008   CHOLHDL 5.2 12/30/2008   VLDL 24 12/30/2008   LDLCALC (H) 12/30/2008    127        Total Cholesterol/HDL:CHD Risk Coronary Heart Disease Risk Table                     Men   Women  1/2 Average Risk   3.4   3.3  Average Risk       5.0   4.4  2 X Average Risk   9.6   7.1  3 X Average Risk  23.4   11.0        Use the calculated Patient Ratio above and the CHD Risk Table to determine the patient's CHD Risk.        ATP III CLASSIFICATION (LDL):  <100     mg/dL   Optimal  100-129  mg/dL    Near or Above                    Optimal  130-159  mg/dL   Borderline  160-189  mg/dL   High  >190     mg/dL   Very High   Lab Results  Component Value Date   TSH 2.075 Test methodology is 3rd generation TSH 12/29/2008    Therapeutic  Level Labs: No results found for: "LITHIUM" No results found for: "VALPROATE" No results found for: "CBMZ"  Current Medications: Current Outpatient Medications  Medication Sig Dispense Refill   dicyclomine (BENTYL) 10 MG capsule Take 1 capsule (10 mg total) by mouth 2 (two) times daily. 60 capsule 5   FLUoxetine (PROZAC) 10 MG capsule TAKE 1 CAPSULE BY MOUTH EVERY DAY 90 capsule 1   metFORMIN (GLUCOPHAGE) 500 MG tablet Take by mouth 2 (two) times daily with a meal.     PEG-KCl-NaCl-NaSulf-Na Asc-C (PLENVU) 140 g SOLR Use as directed. Manufacturer's coupon Universal coupon code:BIN: P2366821; GROUP: [id number]; PCN: [id number]; ID: [id number]; PAY NO MORE $50; NO prior authorization 1 each 0   polyethylene glycol (MIRALAX MIX-IN PAX) 17 g packet Take 17 g by mouth daily. (Patient taking differently: Take 17 g by mouth daily as needed.) 14 each 0   QUEtiapine (SEROQUEL) 50 MG tablet Take 1 tablet (50 mg total) by mouth at bedtime. 30 tablet 2   No current facility-administered medications for this visit.     Musculoskeletal: Strength & Muscle Tone: within normal limits Gait & Station: normal Patient leans: N/A  Psychiatric Specialty Exam: Review of Systems  Psychiatric/Behavioral:  Positive for sleep disturbance. Negative for decreased concentration, dysphoric mood, hallucinations, self-injury and suicidal ideas. The patient is not nervous/anxious and is not hyperactive.     There were no vitals taken for this visit.There is no height or weight on file to calculate BMI.  General Appearance: Casual  Eye Contact:  Good  Speech:  Clear and Coherent and Normal Rate  Volume:  Normal  Mood:  Euthymic  Affect:  Appropriate  Thought Process:  Coherent,  Goal Directed, and Descriptions of Associations: Intact  Orientation:  Full (Time, Place, and Person)  Thought Content: WDL   Suicidal Thoughts:  No  Homicidal Thoughts:  No  Memory:  Immediate;   Good Recent;   Good Remote;   Fair  Judgement:  Good  Insight:  Good  Psychomotor Activity:  Normal  Concentration:  Concentration: Good and Attention Span: Good  Recall:  Good  Fund of Knowledge: Fair  Language: Good  Akathisia:  No  Handed:  Right  AIMS (if indicated): not done  Assets:  Communication Skills Desire for Improvement Financial Resources/Insurance Housing Social Support Transportation  ADL's:  Intact  Cognition: WNL  Sleep:  Fair   Screenings: GAD-7    Flowsheet Row Video Visit from 12/07/2021 in [facility] Office Visit from 09/13/2021 in [facility] Office Visit from 06/14/2021 in [facility] Office Visit from 04/12/2021 in [facility] Office Visit from 02/08/2021 in [facility]  Total GAD-7 Score '1 1 2 1 '$ 0      PHQ2-9    Flowsheet Row Video Visit from 12/07/2021 in [facility] Office Visit from 09/13/2021 in [facility] Office Visit from 06/14/2021 in [facility] Office Visit from 04/12/2021 in [facility] Office Visit from 02/08/2021 in [facility]  PHQ-2 Total Score 0 1 0 0 2  PHQ-9 Total Score -- -- -- -- 4      Flowsheet Row Video Visit from 12/07/2021 in [facility] Office Visit from 09/13/2021 in [facility] Office Visit from 06/14/2021 in [place] No Risk No Risk No  Risk        Assessment and Plan:   Hadriel L. Moorhouse is a 31 year old male with a past psychiatric  history significant for unspecified mood disorder, major depressive disorder, and borderline intellectual functioning who presents to [facility] via virtual video visit for follow-up and medication management.  Patient reports no issues or concerns regarding his current medication regimen.  Patient denies experiencing depressive symptoms or anxiety and he appears to be stable at this time.  Patient to continue taking his medications as prescribed.  Collaboration of Care: Collaboration of Care: Medication Management AEB provider managing patient's psychiatric medications and Psychiatrist AEB patient being followed by a mental health provider  Patient/Guardian was advised Release of Information must be obtained prior to any record release in order to collaborate their care with an outside provider. Patient/Guardian was advised if they have not already done so to contact the registration department to sign all necessary forms in order for [place] to release information regarding their care.   Consent: Patient/Guardian gives verbal consent for treatment and assignment of benefits for services provided during this visit. Patient/Guardian expressed understanding and agreed to proceed.   1. Unspecified mood (affective) disorder ([place]) Patient to continue taking Seroquel 50 mg at bedtime for the management of unspecified mood disorder Patient to continue taking fluoxetine 10 mg daily for the management of his unspecified mood disorder  2. Major depressive disorder, recurrent episode, moderate ([place]) Patient to continue taking fluoxetine 10 mg daily for the management of his major depressive disorder  Patient to follow up in 3 months Provider spent a total of 11 minutes with the patient/reviewing patient's chart  Malachy Mood, PA 12/10/2021, 11:52 PM

## 2021-12-11 ENCOUNTER — Encounter (HOSPITAL_COMMUNITY): Payer: Self-pay | Admitting: Physician Assistant

## 2022-01-10 DIAGNOSIS — H0288A Meibomian gland dysfunction right eye, upper and lower eyelids: Secondary | ICD-10-CM | POA: Diagnosis not present

## 2022-01-10 DIAGNOSIS — E119 Type 2 diabetes mellitus without complications: Secondary | ICD-10-CM | POA: Diagnosis not present

## 2022-01-10 DIAGNOSIS — H04123 Dry eye syndrome of bilateral lacrimal glands: Secondary | ICD-10-CM | POA: Diagnosis not present

## 2022-01-10 DIAGNOSIS — H52223 Regular astigmatism, bilateral: Secondary | ICD-10-CM | POA: Diagnosis not present

## 2022-01-10 DIAGNOSIS — H1045 Other chronic allergic conjunctivitis: Secondary | ICD-10-CM | POA: Diagnosis not present

## 2022-01-10 DIAGNOSIS — H538 Other visual disturbances: Secondary | ICD-10-CM | POA: Diagnosis not present

## 2022-01-10 DIAGNOSIS — H5213 Myopia, bilateral: Secondary | ICD-10-CM | POA: Diagnosis not present

## 2022-01-10 DIAGNOSIS — H0288B Meibomian gland dysfunction left eye, upper and lower eyelids: Secondary | ICD-10-CM | POA: Diagnosis not present

## 2022-02-14 ENCOUNTER — Ambulatory Visit (INDEPENDENT_AMBULATORY_CARE_PROVIDER_SITE_OTHER): Payer: Medicare Other | Admitting: Gastroenterology

## 2022-02-14 ENCOUNTER — Encounter: Payer: Self-pay | Admitting: Gastroenterology

## 2022-02-14 VITALS — BP 90/50 | HR 96 | Ht 68.0 in | Wt 179.2 lb

## 2022-02-14 DIAGNOSIS — R634 Abnormal weight loss: Secondary | ICD-10-CM | POA: Diagnosis not present

## 2022-02-14 DIAGNOSIS — R194 Change in bowel habit: Secondary | ICD-10-CM | POA: Diagnosis not present

## 2022-02-14 DIAGNOSIS — R1013 Epigastric pain: Secondary | ICD-10-CM | POA: Diagnosis not present

## 2022-02-14 MED ORDER — PANTOPRAZOLE SODIUM 40 MG PO TBEC: 40.0000 mg | DELAYED_RELEASE_TABLET | Freq: Every day | ORAL | 5 refills | Status: DC

## 2022-02-14 MED ORDER — PLENVU 140 G PO SOLR: 1.0000 | Freq: Once | ORAL | 0 refills | Status: AC

## 2022-02-14 NOTE — Progress Notes (Signed)
02/14/2022 Nathan Cruz [id number] 05-21-1991   HISTORY OF PRESENT ILLNESS: This is a 31 year old male who was assigned to Dr. Hilarie Fredrickson when he was seen by me in June 2022.  He was scheduled for colonoscopy at that time for complaints of generalized abdominal pain with altered bowel habits and diarrhea.  He ended up canceling that colonoscopy due to fever and never got around to rescheduling.  Now he is here today with complaints of epigastric abdominal pain and weight loss.  He tells me that since May he has been having epigastric abdominal pain.  He says that in May he went for a week or more where he did not eat a full meal, just very small snacks throughout the day.  He said that most of his pain is when he first gets up in the morning and then tends to get better throughout the day, but not always.  He has tried Pepto-Bismol with occasional relief, but not always.  No overt heartburn or reflux.  Bowel habits still alternate.  He denies NSAID use.  He had used MiraLAX previously, but has not been taking that for quite some time.  I had given him some Bentyl last year, but he does not recall ever picking it up and taking it.  He is somewhat of a limited historian.  Past medical history includes unspecified mood disorder with major depression and borderline intellectual functioning, ADHD, ODD, diabetes.   Past Medical History:  Diagnosis Date   ADHD    Diabetes mellitus without complication ([place])    Past Surgical History:  Procedure Laterality Date   NO PAST SURGERIES      reports that he has been smoking. He has never used smokeless tobacco. He reports that he does not currently use alcohol. He reports that he does not use drugs. family history includes Cancer - Ovarian in his maternal aunt; Diabetes in his maternal grandmother; Healthy in his father and mother; Hypertension in his maternal grandmother; Irregular heart beat in his maternal grandmother. Allergies  Allergen Reactions    Prednisone Nausea And Vomiting      Outpatient Encounter Medications as of 02/14/2022  Medication Sig   dicyclomine (BENTYL) 10 MG capsule Take 1 capsule (10 mg total) by mouth 2 (two) times daily.   FLUoxetine (PROZAC) 10 MG capsule TAKE 1 CAPSULE BY MOUTH EVERY DAY   QUEtiapine (SEROQUEL) 50 MG tablet Take 1 tablet (50 mg total) by mouth at bedtime.   metFORMIN (GLUCOPHAGE) 500 MG tablet Take by mouth 2 (two) times daily with a meal. (Patient not taking: Reported on 02/14/2022)   PEG-KCl-NaCl-NaSulf-Na Asc-C (PLENVU) 140 g SOLR Use as directed. Manufacturer's coupon Universal coupon code:BIN: P2366821; GROUP: [id number]; PCN: [id number]; ID: [id number]; PAY NO MORE $50; NO prior authorization (Patient not taking: Reported on 02/14/2022)   polyethylene glycol (MIRALAX MIX-IN PAX) 17 g packet Take 17 g by mouth daily. (Patient not taking: Reported on 02/14/2022)   No facility-administered encounter medications on file as of 02/14/2022.     REVIEW OF SYSTEMS  : All other systems reviewed and negative except where noted in the History of Present Illness.   PHYSICAL EXAM: BP (!) 90/50 (BP Location: Left Arm, Patient Position: Sitting, Cuff Size: Normal)   Pulse 96   Ht '5\' 8"'$  (1.727 m) Comment: height measured without shoes  Wt 179 lb 4 oz (81.3 kg)   BMI 27.25 kg/m  General: Well developed white male in no acute distress Head: Normocephalic and atraumatic  Eyes:  Sclerae anicteric, conjunctiva pink. Ears: Normal auditory acuity Lungs: Clear throughout to auscultation; no W/R/R. Heart: Regular rate and rhythm; no M/R/G. Abdomen: Soft, non-distended.  BS present.  Non-tender. Rectal:  Will be done at the time of colonoscopy. Musculoskeletal: Symmetrical with no gross deformities  Skin: No lesions on visible extremities Extremities: No edema  Neurological: Alert oriented x 4, grossly non-focal Psychological:  Alert and cooperative. Normal mood and affect  ASSESSMENT AND PLAN: *Epigastric  abdominal pain and weight loss: This is new since May.  Wakes up with epigastric pain in the morning that does improve throughout the day.  Sometimes relief with Pepto-Bismol.  No real heartburn or reflux symptoms.  No NSAID use.  We will start pantoprazole 40 mg daily.  Prescription sent to pharmacy.  We will plan for EGD as well and will reschedule colonoscopy as was previously planned as below.  He is down almost 20 pounds since December, 10 pounds since March. *Altered bowel habits with some diarrhea: He was scheduled for colonoscopy last year, but canceled and never got around to rescheduling.  He thinks his symptoms are likely from IBS at things are worse when he is more stressed out or depressed.  Also sounds like he is not the best eating habits.  Previous lab studies were okay.  We will reschedule colonoscopy with Dr. Hilarie Fredrickson.  **The risks, benefits, and alternatives to EGD and colonoscopy were discussed with the patient and he consents to proceed.   CC:  Sueanne Margarita, DO

## 2022-02-14 NOTE — Patient Instructions (Signed)
_______________________________________________________  If you are age 30 or older, your body mass index should be between 23-30. Your Body mass index is 27.25 kg/m. If this is out of the aforementioned range listed, please consider follow up with your Primary Care Provider.  If you are age 69 or younger, your body mass index should be between 19-25. Your Body mass index is 27.25 kg/m. If this is out of the aformentioned range listed, please consider follow up with your Primary Care Provider.   ________________________________________________________  The [facility] providers would like to encourage you to use [facility] to communicate with providers for non-urgent requests or questions.  Due to long hold times on the telephone, sending your provider a message by [facility] may be a faster and more efficient way to get a response.  Please allow 48 business hours for a response.  Please remember that this is for non-urgent requests.  _______________________________________________________  We have sent the following medications to your pharmacy for you to pick up at your convenience:  Pantoprazole  You have been scheduled for an endoscopy and colonoscopy. Please follow the written instructions given to you at your visit today. Please pick up your prep supplies at the pharmacy within the next 1-3 days. If you use inhalers (even only as needed), please bring them with you on the day of your procedure.

## 2022-02-15 NOTE — Progress Notes (Signed)
Addendum: Reviewed and agree with assessment and management plan. Tatyana Biber M, MD  

## 2022-03-05 DIAGNOSIS — H6123 Impacted cerumen, bilateral: Secondary | ICD-10-CM | POA: Diagnosis not present

## 2022-03-08 ENCOUNTER — Telehealth (INDEPENDENT_AMBULATORY_CARE_PROVIDER_SITE_OTHER): Payer: Medicare Other | Admitting: Physician Assistant

## 2022-03-08 ENCOUNTER — Encounter (HOSPITAL_COMMUNITY): Payer: Self-pay | Admitting: Physician Assistant

## 2022-03-08 DIAGNOSIS — F39 Unspecified mood [affective] disorder: Secondary | ICD-10-CM | POA: Diagnosis not present

## 2022-03-08 DIAGNOSIS — F331 Major depressive disorder, recurrent, moderate: Secondary | ICD-10-CM | POA: Diagnosis not present

## 2022-03-08 MED ORDER — QUETIAPINE FUMARATE 50 MG PO TABS: 50.0000 mg | ORAL_TABLET | Freq: Every day | ORAL | 2 refills | Status: DC

## 2022-03-08 MED ORDER — FLUOXETINE HCL 10 MG PO CAPS: ORAL_CAPSULE | ORAL | 2 refills | Status: DC

## 2022-03-08 NOTE — Progress Notes (Signed)
BH MD/PA/NP OP Progress Note  Virtual Visit via Telephone Note  I connected with Nathan Cruz on 03/08/22 at 11:00 AM EDT by telephone and verified that I am speaking with the correct person using two identifiers.  Location: Patient: Home Provider: Clinic   I discussed the limitations, risks, security and privacy concerns of performing an evaluation and management service by telephone and the availability of in person appointments. I also discussed with the patient that there may be a patient responsible charge related to this service. The patient expressed understanding and agreed to proceed.  Follow Up Instructions:  I discussed the assessment and treatment plan with the patient. The patient was provided an opportunity to ask questions and all were answered. The patient agreed with the plan and demonstrated an understanding of the instructions.   The patient was advised to call back or seek an in-person evaluation if the symptoms worsen or if the condition fails to improve as anticipated.  I provided 7 minutes of non-face-to-face time during this encounter.  Meta Hatchet, PA   03/08/2022 11:16 AM Nathan Cruz  MRN:  [id number]  Chief Complaint:  Chief Complaint  Patient presents with   Follow-up   Medication Refill    HPI:   Nathan Cruz is a 31 year old male with a past psychiatric history significant for unspecified mood disorder, major depressive disorder, and borderline intellectual functioning who presents to [facility] via virtual video visit for follow-up and medication management.  Patient is currently being managed on the following medication:   Seroquel 50 mg at bedtime. Fluoxetine 10 mg daily  Patient reports no issues or concerns regarding his current medication regimen.  Patient denies experiencing any adverse side effects at this time.  Patient denies depressive symptoms or anxiety.  Patient denies any new  stressors at this time.  A GAD-7 screen was performed with the patient scoring a 3.  Patient is alert and oriented x4, calm, cooperative, and fully engaged in conversation during the encounter.  Patient endorses good mood.  Patient denies suicidal or homicidal ideations.  He further denies auditory or visual hallucinations and does not appear to be responding to internal/external stimuli.  Patient endorses fair sleep and receives on average 5 to 6 hours of sleep each night.  Patient endorses good appetite and eats on average 2 meals per day.  Patient denies alcohol consumption, illicit drug use, and tobacco use.  Patient does endorse engaging in the use of delta 8.  Visit Diagnosis:    ICD-10-CM   1. Unspecified mood (affective) disorder (HCC)  F39 QUEtiapine (SEROQUEL) 50 MG tablet    FLUoxetine (PROZAC) 10 MG capsule    2. Major depressive disorder, recurrent episode, moderate (HCC)  F33.1 FLUoxetine (PROZAC) 10 MG capsule      Past Psychiatric History:  History of PTSD, ADHD, ODD.     Has history of 1 prior psychiatric hospitalization at the age of 2 back in 2012 at [facility], was diagnosed with ADHD, ODD, PTSD, parent-child problem at that time.  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  The hospitalization occurred subsequent to him using a small knife to inflict lacerations on his left wrist and also having auditory loose Nations.  He had written a suicide note on Facebook at that time.   He has history of being in juvenile detention for skipping school when he was a teenager  Past Medical History:  Past Medical History:  Diagnosis  Date   ADHD    Diabetes mellitus without complication [facility])     Past Surgical History:  Procedure Laterality Date   NO PAST SURGERIES      Family Psychiatric History:  Denies any family psychiatric history.   Family History:  Family History  Problem Relation Age of Onset   Healthy Mother    Healthy Father    Cancer -  Ovarian Maternal Aunt    Diabetes Maternal Grandmother    Hypertension Maternal Grandmother    Irregular heart beat Maternal Grandmother    Colon polyps Neg Hx    Colon cancer Neg Hx     Social History:  Social History   Socioeconomic History   Marital status: Single    Spouse name: Not on file   Number of children: Not on file   Years of education: Not on file   Highest education level: Not on file  Occupational History   Not on file  Tobacco Use   Smoking status: Some Days   Smokeless tobacco: Never   Tobacco comments:    Smokes Hooka occasionally  Vaping Use   Vaping Use: Some days  Substance and Sexual Activity   Alcohol use: Not Currently   Drug use: No   Sexual activity: Not on file  Other Topics Concern   Not on file  Social History Narrative   Not on file   Social Determinants of Health   Financial Resource Strain: Not on file  Food Insecurity: Not on file  Transportation Needs: Not on file  Physical Activity: Not on file  Stress: Not on file  Social Connections: Not on file    Allergies:  Allergies  Allergen Reactions   Prednisone Nausea And Vomiting    Metabolic Disorder Labs: Lab Results  Component Value Date   HGBA1C  12/30/2008    5.2 (NOTE) The ADA recommends the following therapeutic goal for glycemic control related to Hgb A1c measurement: Goal of therapy: <6.5 Hgb A1c  Reference: American Diabetes Association: Clinical Practice Recommendations 2010, Diabetes Care, 2010, 33: (Suppl  1).   MPG 103 12/30/2008   No results found for: "PROLACTIN" Lab Results  Component Value Date   CHOL (H) 12/30/2008    187        ATP III CLASSIFICATION:  <200     mg/dL   Desirable  413-244  mg/dL   Borderline High  >=010    mg/dL   High          TRIG 272 12/30/2008   HDL 36 12/30/2008   CHOLHDL 5.2 12/30/2008   VLDL 24 12/30/2008   LDLCALC (H) 12/30/2008    127        Total Cholesterol/HDL:CHD Risk Coronary Heart Disease Risk Table                      Men   Women  1/2 Average Risk   3.4   3.3  Average Risk       5.0   4.4  2 X Average Risk   9.6   7.1  3 X Average Risk  23.4   11.0        Use the calculated Patient Ratio above and the CHD Risk Table to determine the patient's CHD Risk.        ATP III CLASSIFICATION (LDL):  <100     mg/dL   Optimal  536-644  mg/dL   Near or Above  Optimal  130-159  mg/dL   Borderline  130-865  mg/dL   High  >784     mg/dL   Very High   Lab Results  Component Value Date   TSH 2.075 Test methodology is 3rd generation TSH 12/29/2008    Therapeutic Level Labs: No results found for: "LITHIUM" No results found for: "VALPROATE" No results found for: "CBMZ"  Current Medications: Current Outpatient Medications  Medication Sig Dispense Refill   dicyclomine (BENTYL) 10 MG capsule Take 1 capsule (10 mg total) by mouth 2 (two) times daily. 60 capsule 5   FLUoxetine (PROZAC) 10 MG capsule TAKE 1 CAPSULE BY MOUTH EVERY DAY 30 capsule 2   metFORMIN (GLUCOPHAGE) 500 MG tablet Take by mouth 2 (two) times daily with a meal. (Patient not taking: Reported on 02/14/2022)     pantoprazole (PROTONIX) 40 MG tablet Take 1 tablet (40 mg total) by mouth daily. 30 tablet 5   PEG-KCl-NaCl-NaSulf-Na Asc-C (PLENVU) 140 g SOLR Use as directed. Manufacturer's coupon Universal coupon code:BIN: G6837245; GROUP: [id number]; PCN: [id number]; ID: [id number]; PAY NO MORE $50; NO prior authorization (Patient not taking: Reported on 02/14/2022) 1 each 0   polyethylene glycol (MIRALAX MIX-IN PAX) 17 g packet Take 17 g by mouth daily. (Patient not taking: Reported on 02/14/2022) 14 each 0   QUEtiapine (SEROQUEL) 50 MG tablet Take 1 tablet (50 mg total) by mouth at bedtime. 30 tablet 2   No current facility-administered medications for this visit.     Musculoskeletal: Strength & Muscle Tone: within normal limits Gait & Station: normal Patient leans: N/A  Psychiatric Specialty Exam: Review of Systems   Psychiatric/Behavioral:  Positive for sleep disturbance. Negative for decreased concentration, dysphoric mood, hallucinations, self-injury and suicidal ideas. The patient is not nervous/anxious and is not hyperactive.     There were no vitals taken for this visit.There is no height or weight on file to calculate BMI.  General Appearance: Casual  Eye Contact:  Good  Speech:  Clear and Coherent and Normal Rate  Volume:  Normal  Mood:  Euthymic  Affect:  Appropriate  Thought Process:  Coherent, Goal Directed, and Descriptions of Associations: Intact  Orientation:  Full (Time, Place, and Person)  Thought Content: WDL   Suicidal Thoughts:  No  Homicidal Thoughts:  No  Memory:  Immediate;   Good Recent;   Good Remote;   Fair  Judgement:  Good  Insight:  Good  Psychomotor Activity:  Normal  Concentration:  Concentration: Good and Attention Span: Good  Recall:  Good  Fund of Knowledge: Fair  Language: Good  Akathisia:  No  Handed:  Right  AIMS (if indicated): not done  Assets:  Communication Skills Desire for Improvement Financial Resources/Insurance Housing Social Support Transportation  ADL's:  Intact  Cognition: WNL  Sleep:  Fair   Screenings: GAD-7    Flowsheet Row Video Visit from 03/08/2022 in [facility] Video Visit from 12/07/2021 in [facility] Office Visit from 09/13/2021 in [facility] Office Visit from 06/14/2021 in [facility] Office Visit from 04/12/2021 in [facility]  Total GAD-7 Score 3 1 1 2 1       PHQ2-9    Flowsheet Row Video Visit from 03/08/2022 in [facility] Video Visit from 12/07/2021 in [facility] Office Visit from 09/13/2021 in [facility] Office Visit from 06/14/2021 in [facility]  Center Office Visit from  04/12/2021 in [facility]  PHQ-2 Total Score 0 0 1 0 0      Flowsheet Row Video Visit from 03/08/2022 in [facility] Video Visit from 12/07/2021 in [facility] Office Visit from 09/13/2021 in [facility]  C-SSRS RISK CATEGORY No Risk No Risk No Risk        Assessment and Plan:   Nathan Cruz is a 31 year old male with a past psychiatric history significant for unspecified mood disorder, major depressive disorder, and borderline intellectual functioning who presents to [facility] via virtual telephone visit for follow-up and medication management.  Patient reports no issues or concerns regarding his current medication regimen.  Patient denies the need for dosage adjustments at this time and is requesting refills on all of his medications following the conclusion of the encounter.  Patient denies depression or anxiety and appears stable on his current medication regimen.  Patient's medication to be e-prescribed to pharmacy of choice.  Collaboration of Care: Collaboration of Care: Medication Management AEB provider managing patient's psychiatric medications and Psychiatrist AEB patient being followed by a mental health provider  Patient/Guardian was advised Release of Information must be obtained prior to any record release in order to collaborate their care with an outside provider. Patient/Guardian was advised if they have not already done so to contact the registration department to sign all necessary forms in order for [place] to release information regarding their care.   Consent: Patient/Guardian gives verbal consent for treatment and assignment of benefits for services provided during this visit. Patient/Guardian expressed understanding and agreed to proceed.   1. Unspecified mood (affective) disorder (HCC)  - QUEtiapine (SEROQUEL) 50 MG  tablet; Take 1 tablet (50 mg total) by mouth at bedtime.  Dispense: 30 tablet; Refill: 2 - FLUoxetine (PROZAC) 10 MG capsule; TAKE 1 CAPSULE BY MOUTH EVERY DAY  Dispense: 30 capsule; Refill: 2  2. Major depressive disorder, recurrent episode, moderate (HCC)  - FLUoxetine (PROZAC) 10 MG capsule; TAKE 1 CAPSULE BY MOUTH EVERY DAY  Dispense: 30 capsule; Refill: 2  Patient to follow up in 3 months Provider spent a total of 7 minutes with the patient/reviewing patient's chart  Meta Hatchet, PA 03/08/2022, 11:16 AM

## 2022-04-01 ENCOUNTER — Ambulatory Visit (AMBULATORY_SURGERY_CENTER): Payer: Medicare Other | Admitting: Internal Medicine

## 2022-04-01 ENCOUNTER — Encounter: Payer: Self-pay | Admitting: Internal Medicine

## 2022-04-01 VITALS — BP 106/64 | HR 63 | Temp 98.9°F | Resp 12 | Ht 68.0 in | Wt 179.0 lb

## 2022-04-01 DIAGNOSIS — R194 Change in bowel habit: Secondary | ICD-10-CM | POA: Diagnosis not present

## 2022-04-01 DIAGNOSIS — K3189 Other diseases of stomach and duodenum: Secondary | ICD-10-CM | POA: Diagnosis not present

## 2022-04-01 DIAGNOSIS — K209 Esophagitis, unspecified without bleeding: Secondary | ICD-10-CM | POA: Diagnosis not present

## 2022-04-01 DIAGNOSIS — K5730 Diverticulosis of large intestine without perforation or abscess without bleeding: Secondary | ICD-10-CM

## 2022-04-01 DIAGNOSIS — D125 Benign neoplasm of sigmoid colon: Secondary | ICD-10-CM | POA: Diagnosis not present

## 2022-04-01 DIAGNOSIS — K2100 Gastro-esophageal reflux disease with esophagitis, without bleeding: Secondary | ICD-10-CM

## 2022-04-01 DIAGNOSIS — R1013 Epigastric pain: Secondary | ICD-10-CM

## 2022-04-01 DIAGNOSIS — K635 Polyp of colon: Secondary | ICD-10-CM

## 2022-04-01 DIAGNOSIS — K219 Gastro-esophageal reflux disease without esophagitis: Secondary | ICD-10-CM | POA: Diagnosis not present

## 2022-04-01 MED ORDER — SODIUM CHLORIDE 0.9 % IV SOLN: 500.0000 mL | INTRAVENOUS | Status: DC

## 2022-04-01 NOTE — Progress Notes (Signed)
To pacu, VSS. Report to [email or web address] 

## 2022-04-01 NOTE — Patient Instructions (Signed)
Handouts on polyps, diverticulosis, and esophagitis given to you today Await pathology results Continue current medications, resume previous diet, return to normal activities tomorrow   YOU HAD AN ENDOSCOPIC PROCEDURE TODAY AT [facility]:   Refer to the procedure report that was given to you for any specific questions about what was found during the examination.  If the procedure report does not answer your questions, please call your gastroenterologist to clarify.  If you requested that your care partner not be given the details of your procedure findings, then the procedure report has been included in a sealed envelope for you to review at your convenience later.  YOU SHOULD EXPECT: Some feelings of bloating in the abdomen. Passage of more gas than usual.  Walking can help get rid of the air that was put into your GI tract during the procedure and reduce the bloating. If you had a lower endoscopy (such as a colonoscopy or flexible sigmoidoscopy) you may notice spotting of blood in your stool or on the toilet paper. If you underwent a bowel prep for your procedure, you may not have a normal bowel movement for a few days.  Please Note:  You might notice some irritation and congestion in your nose or some drainage.  This is from the oxygen used during your procedure.  There is no need for concern and it should clear up in a day or so.  SYMPTOMS TO REPORT IMMEDIATELY:  Following lower endoscopy (colonoscopy or flexible sigmoidoscopy):  Excessive amounts of blood in the stool  Significant tenderness or worsening of abdominal pains  Swelling of the abdomen that is new, acute  Fever of 100F or higher  Following upper endoscopy (EGD)  Vomiting of blood or coffee ground material  New chest pain or pain under the shoulder blades  Painful or persistently difficult swallowing  New shortness of breath  Fever of 100F or higher  Black, tarry-looking stools  For urgent or emergent  issues, a gastroenterologist can be reached at any hour by calling [phone]. Do not use MyChart messaging for urgent concerns.    DIET:  We do recommend a small meal at first, but then you may proceed to your regular diet.  Drink plenty of fluids but you should avoid alcoholic beverages for 24 hours.  ACTIVITY:  You should plan to take it easy for the rest of today and you should NOT DRIVE or use heavy machinery until tomorrow (because of the sedation medicines used during the test).    FOLLOW UP: Our staff will call the number listed on your records the next business day following your procedure.  We will call around 7:15- 8:00 am to check on you and address any questions or concerns that you may have regarding the information given to you following your procedure. If we do not reach you, we will leave a message.     If any biopsies were taken you will be contacted by phone or by letter within the next 1-3 weeks.  Please call us at [phone] if you have not heard about the biopsies in 3 weeks.   Repeat colonoscopy recommended based on the results from today's procedure.   SIGNATURES/CONFIDENTIALITY: You and/or your care partner have signed paperwork which will be entered into your electronic medical record.  These signatures attest to the fact that that the information above on your After Visit Summary has been reviewed and is understood.  Full responsibility of the confidentiality of this discharge information  lies with you and/or your care-partner.  

## 2022-04-01 NOTE — Progress Notes (Signed)
Called to room to assist during endoscopic procedure.  Patient ID and intended procedure confirmed with present staff. Received instructions for my participation in the procedure from the performing physician.  

## 2022-04-01 NOTE — Progress Notes (Signed)
GASTROENTEROLOGY PROCEDURE H&P NOTE   Primary Care Physician: Sueanne Margarita, DO    Reason for Procedure:  Epigastric pain, weight loss, diarrhea with altered bowel habits  Plan:    EGD colonoscopy  Patient is appropriate for endoscopic procedure(s) in the ambulatory ([place]) setting.  The nature of the procedure, as well as the risks, benefits, and alternatives were carefully and thoroughly reviewed with the patient. Ample time for discussion and questions allowed. The patient understood, was satisfied, and agreed to proceed.     HPI: Nathan Cruz is a 31 y.o. male who presents for EGD and colonoscopy.  Medical history as below.  Tolerated the prep.  No recent chest pain or shortness of breath.  No abdominal pain today.  Past Medical History:  Diagnosis Date   ADHD    Diabetes mellitus without complication ([place])     Past Surgical History:  Procedure Laterality Date   NO PAST SURGERIES      Prior to Admission medications   Medication Sig Start Date End Date Taking? Authorizing Provider  dicyclomine (BENTYL) 10 MG capsule Take 1 capsule (10 mg total) by mouth 2 (two) times daily. 12/26/20  Yes Zehr, Laban Emperor, PA-C  FLUoxetine (PROZAC) 10 MG capsule TAKE 1 CAPSULE BY MOUTH EVERY DAY Patient not taking: Reported on 04/01/2022 03/08/22   Malachy Mood, PA  metFORMIN (GLUCOPHAGE) 500 MG tablet Take by mouth 2 (two) times daily with a meal. Patient not taking: Reported on 02/14/2022    [provider]  pantoprazole (PROTONIX) 40 MG tablet Take 1 tablet (40 mg total) by mouth daily. 02/14/22   Zehr, Janett Billow D, PA-C  polyethylene glycol (MIRALAX MIX-IN PAX) 17 g packet Take 17 g by mouth daily. Patient not taking: Reported on 02/14/2022 08/12/20   Fransico Meadow, PA-C  QUEtiapine (SEROQUEL) 50 MG tablet Take 1 tablet (50 mg total) by mouth at bedtime. Patient not taking: Reported on 04/01/2022 03/08/22   Malachy Mood, PA    Current Outpatient Medications  Medication  Sig Dispense Refill   dicyclomine (BENTYL) 10 MG capsule Take 1 capsule (10 mg total) by mouth 2 (two) times daily. 60 capsule 5   FLUoxetine (PROZAC) 10 MG capsule TAKE 1 CAPSULE BY MOUTH EVERY DAY (Patient not taking: Reported on 04/01/2022) 30 capsule 2   metFORMIN (GLUCOPHAGE) 500 MG tablet Take by mouth 2 (two) times daily with a meal. (Patient not taking: Reported on 02/14/2022)     pantoprazole (PROTONIX) 40 MG tablet Take 1 tablet (40 mg total) by mouth daily. 30 tablet 5   polyethylene glycol (MIRALAX MIX-IN PAX) 17 g packet Take 17 g by mouth daily. (Patient not taking: Reported on 02/14/2022) 14 each 0   QUEtiapine (SEROQUEL) 50 MG tablet Take 1 tablet (50 mg total) by mouth at bedtime. (Patient not taking: Reported on 04/01/2022) 30 tablet 2   Current Facility-Administered Medications  Medication Dose Route Frequency Provider Last Rate Last Admin   0.9 %  sodium chloride infusion  500 mL Intravenous Continuous Aurie Harroun, Lajuan Lines, MD        Allergies as of 04/01/2022 - Review Complete 04/01/2022  Allergen Reaction Noted   Prednisone Nausea And Vomiting 03/04/2013    Family History  Problem Relation Age of Onset   Healthy Mother    Healthy Father    Cancer - Ovarian Maternal Aunt    Diabetes Maternal Grandmother    Hypertension Maternal Grandmother    Irregular heart beat Maternal Grandmother  Colon polyps Neg Hx    Colon cancer Neg Hx     Social History   Socioeconomic History   Marital status: Single    Spouse name: Not on file   Number of children: Not on file   Years of education: Not on file   Highest education level: Not on file  Occupational History   Not on file  Tobacco Use   Smoking status: Some Days   Smokeless tobacco: Never   Tobacco comments:    [place] occasionally  Vaping Use   Vaping Use: Some days  Substance and Sexual Activity   Alcohol use: Not Currently   Drug use: Yes    Comment: [employer] 8 /THC   Sexual activity: Not on file  Other  Topics Concern   Not on file  Social History Narrative   Not on file   Social Determinants of Health   Financial Resource Strain: Not on file  Food Insecurity: Not on file  Transportation Needs: Not on file  Physical Activity: Not on file  Stress: Not on file  Social Connections: Not on file  Intimate Partner Violence: Not on file    Physical Exam: Vital signs in last 24 hours: '@BP'$  110/67   Pulse 93   Temp 98.9 F (37.2 C)   Resp (!) 6   Ht '5\' 8"'$  (1.727 m)   Wt 179 lb (81.2 kg)   SpO2 100%   BMI 27.22 kg/m  GEN: NAD EYE: Sclerae anicteric ENT: MMM CV: Non-tachycardic Pulm: CTA b/l GI: Soft, NT/ND NEURO:  Alert & Oriented x 3   Zenovia Jarred, MD [facility]  04/01/2022 8:00 AM

## 2022-04-01 NOTE — Op Note (Signed)
[name] Procedure Date: 04/01/2022 7:57 AM MRN: [id number] Endoscopist: Jerene Bears , MD Age: 31 Referring MD:  Date of Birth: May 27, 1991 Gender: Male Account #: [id number] Procedure:                Upper GI endoscopy Indications:              Epigastric abdominal pain, Diarrhea, Dyspepsia Medicines:                Monitored Anesthesia Care Procedure:                Pre-Anesthesia Assessment:                           - Prior to the procedure, a History and Physical                            was performed, and patient medications and                            allergies were reviewed. The patient's tolerance of                            previous anesthesia was also reviewed. The risks                            and benefits of the procedure and the sedation                            options and risks were discussed with the patient.                            All questions were answered, and informed consent                            was obtained. Prior Anticoagulants: The patient has                            taken no previous anticoagulant or antiplatelet                            agents. ASA Grade Assessment: II - A patient with                            mild systemic disease. After reviewing the risks                            and benefits, the patient was deemed in                            satisfactory condition to undergo the procedure.                           After obtaining informed consent, the endoscope was  passed under direct vision. Throughout the                            procedure, the patient's blood pressure, pulse, and                            oxygen saturations were monitored continuously. The                            GIF [id number] #[id number] was introduced through the                            mouth, and advanced to the second part of duodenum.                            The upper GI  endoscopy was accomplished without                            difficulty. The patient tolerated the procedure                            well. Scope In: Scope Out: Findings:                 LA Grade A (one or more mucosal breaks less than 5                            mm, not extending between tops of 2 mucosal folds)                            esophagitis was found at the gastroesophageal                            junction. Biopsies were taken with a cold forceps                            for histology.                           The entire examined stomach was normal. Biopsies                            were taken with a cold forceps for histology and                            Helicobacter pylori testing.                           A small diverticulum was found in the second                            portion of the duodenum.                           The exam of the duodenum was  otherwise normal.                           Biopsies for histology were taken with a cold                            forceps in the second portion of the duodenum for                            evaluation of celiac disease. Complications:            No immediate complications. Estimated Blood Loss:     Estimated blood loss was minimal. Impression:               - Mild acid reflux esophagitis. Biopsied.                           - Normal stomach. Biopsied.                           - Duodenal diverticulum.                           - Biopsies were taken with a cold forceps for                            evaluation of celiac disease. Recommendation:           - Patient has a contact number available for                            emergencies. The signs and symptoms of potential                            delayed complications were discussed with the                            patient. Return to normal activities tomorrow.                            Written discharge instructions were provided to the                             patient.                           - Resume previous diet.                           - Continue present medications.                           - Await pathology results.                           - See the other procedure note for documentation of  additional recommendations. Jerene Bears, MD 04/01/2022 8:35:06 AM This report has been signed electronically.

## 2022-04-01 NOTE — Op Note (Signed)
[place] Patient Name: Nathan Cruz Procedure Date: 04/01/2022 7:57 AM MRN: [id number] Endoscopist: Jerene Bears , MD Age: 31 Referring MD:  Date of Birth: 1991-05-14 Gender: Male Account #: [id number] Procedure:                Colonoscopy Indications:              Generalized abdominal pain, Change in bowel habits Medicines:                Monitored Anesthesia Care Procedure:                Pre-Anesthesia Assessment:                           - Prior to the procedure, a History and Physical                            was performed, and patient medications and                            allergies were reviewed. The patient's tolerance of                            previous anesthesia was also reviewed. The risks                            and benefits of the procedure and the sedation                            options and risks were discussed with the patient.                            All questions were answered, and informed consent                            was obtained. Prior Anticoagulants: The patient has                            taken no previous anticoagulant or antiplatelet                            agents. ASA Grade Assessment: II - A patient with                            mild systemic disease. After reviewing the risks                            and benefits, the patient was deemed in                            satisfactory condition to undergo the procedure.                           After obtaining informed consent, the colonoscope  was passed under direct vision. Throughout the                            procedure, the patient's blood pressure, pulse, and                            oxygen saturations were monitored continuously. The                            Olympus CF-[id number] ([id number]) Colonoscope was                            introduced through the anus and advanced to the                            terminal ileum. The  colonoscopy was performed                            without difficulty. The patient tolerated the                            procedure well. The quality of the bowel                            preparation was excellent. The terminal ileum,                            ileocecal valve, appendiceal orifice, and rectum                            were photographed. Scope In: 8:16:02 AM Scope Out: 8:27:59 AM Scope Withdrawal Time: 0 hours 9 minutes 26 seconds  Total Procedure Duration: 0 hours 11 minutes 57 seconds  Findings:                 The digital rectal exam was normal.                           The terminal ileum appeared normal.                           Multiple small-mouthed diverticula were found in                            the sigmoid colon.                           A 4 mm polyp was found in the sigmoid colon. The                            polyp was sessile. The polyp was removed with a                            cold snare. Resection and retrieval were complete.  The exam was otherwise without abnormality on                            direct and retroflexion views. Complications:            No immediate complications. Estimated Blood Loss:     Estimated blood loss: none. Impression:               - The examined portion of the ileum was normal.                           - Mild diverticulosis in the sigmoid colon.                           - One 4 mm polyp in the sigmoid colon, removed with                            a cold snare. Resected and retrieved.                           - The examination was otherwise normal on direct                            and retroflexion views. Recommendation:           - Patient has a contact number available for                            emergencies. The signs and symptoms of potential                            delayed complications were discussed with the                            patient. Return to normal  activities tomorrow.                            Written discharge instructions were provided to the                            patient.                           - Resume previous diet.                           - Continue present medications.                           - IBS expected. If recurrent loose stools consider                            dicyclomine PRN.                           - Await pathology results.                           -  Repeat colonoscopy is recommended. The                            colonoscopy date will be determined after pathology                            results from today's exam become available for                            review. Jerene Bears, MD 04/01/2022 8:37:59 AM This report has been signed electronically.

## 2022-04-02 ENCOUNTER — Telehealth: Payer: Self-pay | Admitting: *Deleted

## 2022-04-02 NOTE — Telephone Encounter (Signed)
Follow up call attempt. VM not accepting messages.

## 2022-04-04 ENCOUNTER — Encounter: Payer: Self-pay | Admitting: Internal Medicine

## 2022-04-09 ENCOUNTER — Telehealth: Payer: Self-pay | Admitting: Internal Medicine

## 2022-04-09 NOTE — Telephone Encounter (Signed)
Spoke with pt and path letter reviewed with him over the phone. He verbalized understanding.

## 2022-04-09 NOTE — Telephone Encounter (Signed)
Inbound call from patient requesting the results from his colonoscopy if they are available. Thank you

## 2022-04-26 DIAGNOSIS — R109 Unspecified abdominal pain: Secondary | ICD-10-CM | POA: Diagnosis not present

## 2022-04-26 DIAGNOSIS — E1169 Type 2 diabetes mellitus with other specified complication: Secondary | ICD-10-CM | POA: Diagnosis not present

## 2022-04-26 DIAGNOSIS — Z23 Encounter for immunization: Secondary | ICD-10-CM | POA: Diagnosis not present

## 2022-04-26 DIAGNOSIS — R519 Headache, unspecified: Secondary | ICD-10-CM | POA: Diagnosis not present

## 2022-04-26 DIAGNOSIS — E663 Overweight: Secondary | ICD-10-CM | POA: Diagnosis not present

## 2022-04-26 DIAGNOSIS — E781 Pure hyperglyceridemia: Secondary | ICD-10-CM | POA: Diagnosis not present

## 2022-06-04 ENCOUNTER — Telehealth (INDEPENDENT_AMBULATORY_CARE_PROVIDER_SITE_OTHER): Payer: Medicare Other | Admitting: Psychiatry

## 2022-06-04 DIAGNOSIS — F1210 Cannabis abuse, uncomplicated: Secondary | ICD-10-CM

## 2022-06-04 DIAGNOSIS — F39 Unspecified mood [affective] disorder: Secondary | ICD-10-CM | POA: Diagnosis not present

## 2022-06-04 DIAGNOSIS — F331 Major depressive disorder, recurrent, moderate: Secondary | ICD-10-CM | POA: Diagnosis not present

## 2022-06-04 MED ORDER — QUETIAPINE FUMARATE 50 MG PO TABS: 50.0000 mg | ORAL_TABLET | Freq: Every day | ORAL | 2 refills | Status: DC

## 2022-06-04 MED ORDER — FLUOXETINE HCL 10 MG PO CAPS: ORAL_CAPSULE | ORAL | 2 refills | Status: DC
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
## 2022-06-05 ENCOUNTER — Telehealth (HOSPITAL_COMMUNITY): Payer: Medicare Other | Admitting: Physician Assistant

## 2022-06-10 ENCOUNTER — Encounter (HOSPITAL_COMMUNITY): Payer: Self-pay | Admitting: Psychiatry

## 2022-08-16 IMAGING — CR DG ABDOMEN 1V
2 series · 2 of 2 positions shown · non-contrast
Comparison: None.

CLINICAL DATA: Abdominal pain.

EXAM:
ABDOMEN - 1 VIEW

[abdomen kub (1 of 2)]
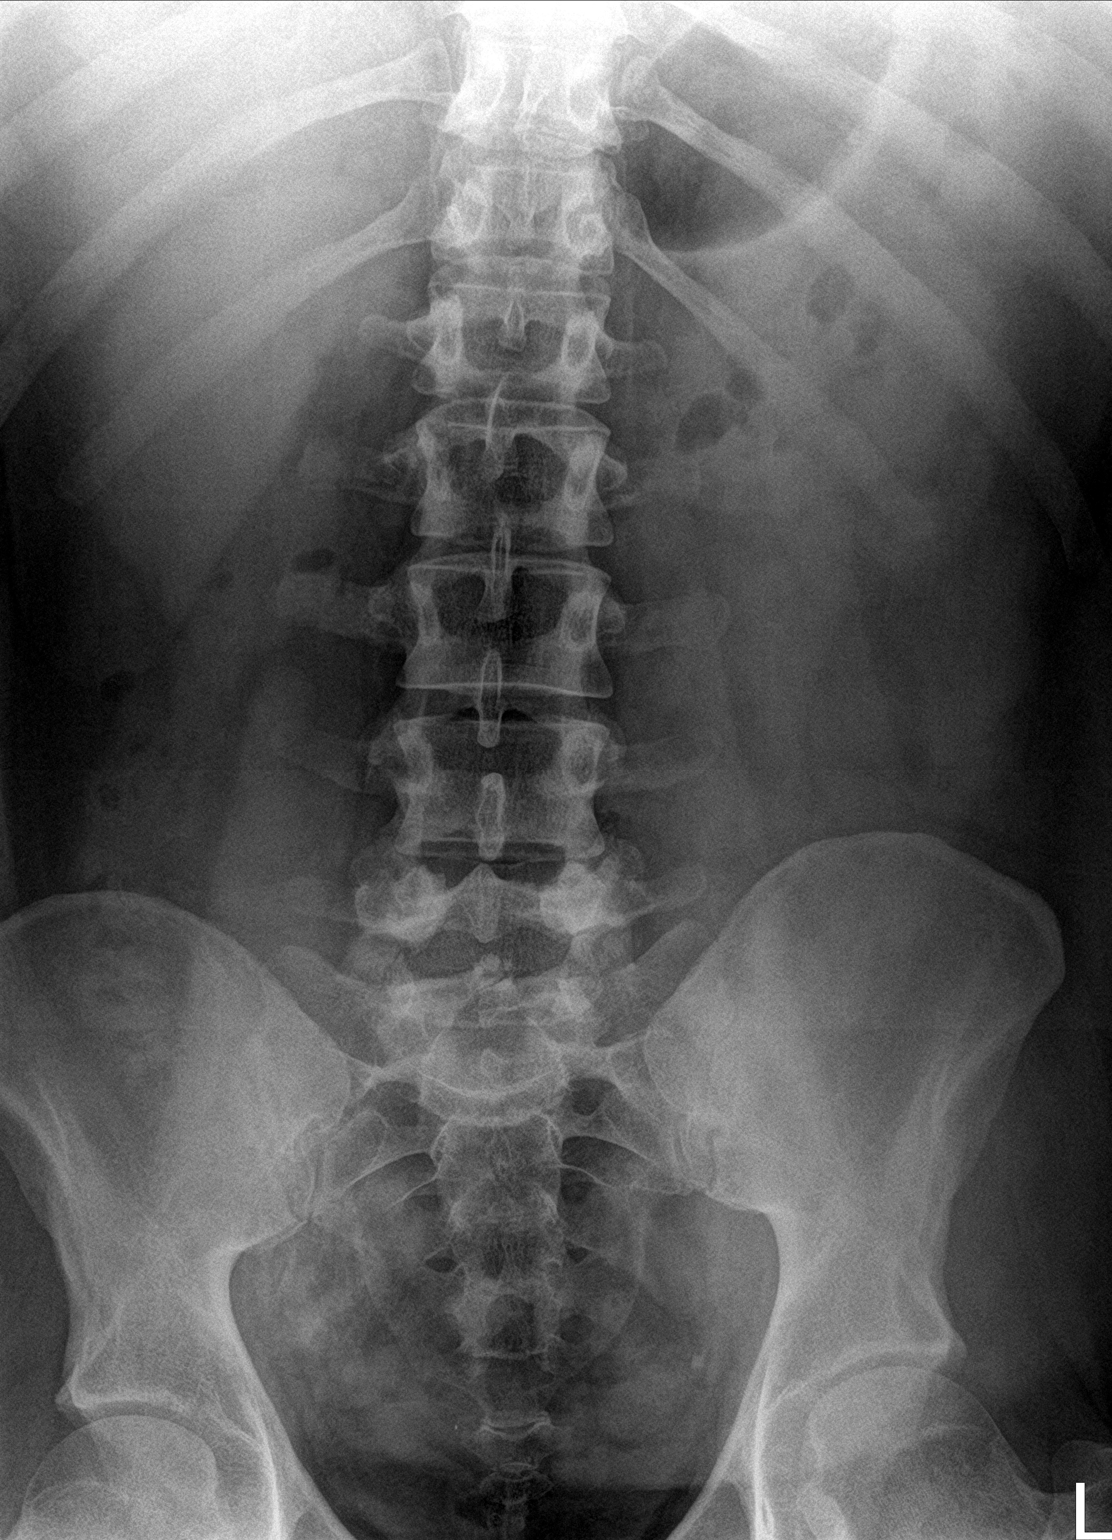

[abdomen kub (2 of 2)]
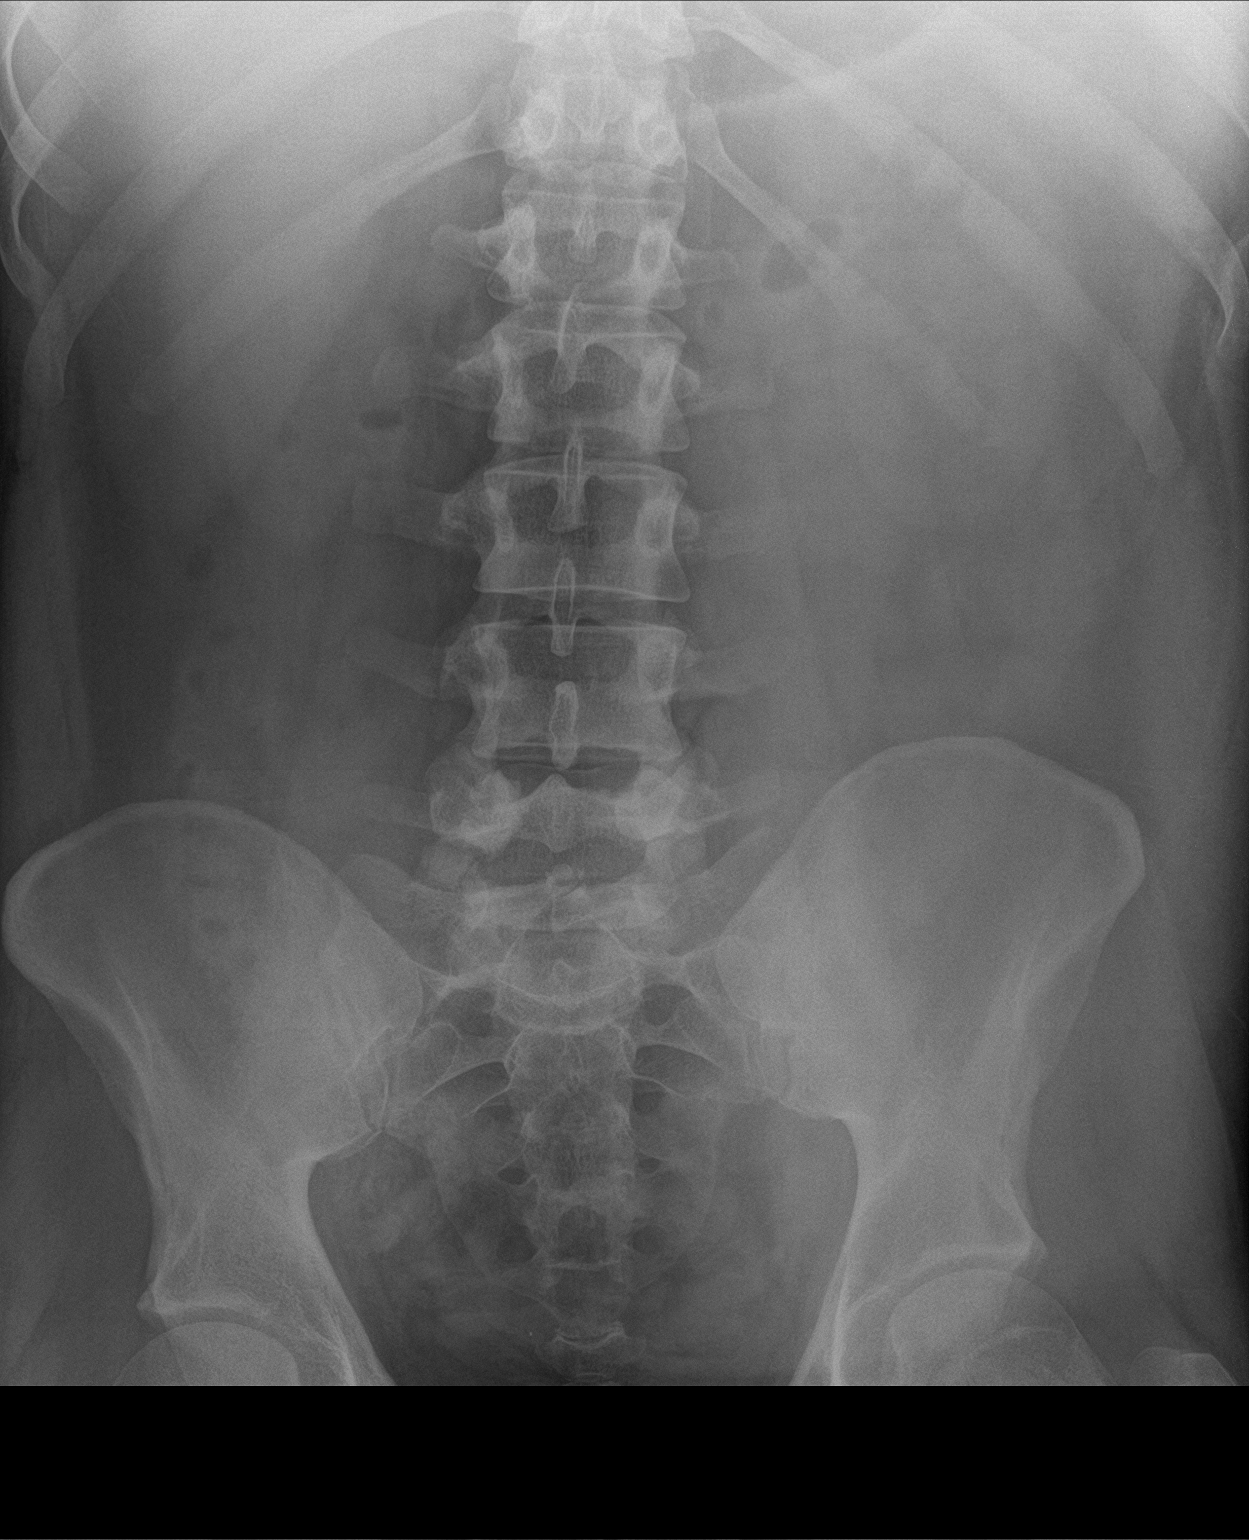

[2 of 2 positions shown; findings below may reference images not displayed]

FINDINGS: The bowel gas pattern is normal. No radio-opaque calculi or other
significant radiographic abnormality are seen. No acute osseous
abnormality.
IMPRESSION: Negative.

## 2022-11-15 ENCOUNTER — Other Ambulatory Visit: Payer: Self-pay | Admitting: Gastroenterology

## 2023-01-17 DIAGNOSIS — E781 Pure hyperglyceridemia: Secondary | ICD-10-CM | POA: Diagnosis not present

## 2023-01-17 DIAGNOSIS — R7989 Other specified abnormal findings of blood chemistry: Secondary | ICD-10-CM | POA: Diagnosis not present

## 2023-01-17 DIAGNOSIS — E1169 Type 2 diabetes mellitus with other specified complication: Secondary | ICD-10-CM | POA: Diagnosis not present

## 2023-01-24 DIAGNOSIS — Z1331 Encounter for screening for depression: Secondary | ICD-10-CM | POA: Diagnosis not present

## 2023-01-24 DIAGNOSIS — E663 Overweight: Secondary | ICD-10-CM | POA: Diagnosis not present

## 2023-01-24 DIAGNOSIS — Z0000 Encounter for general adult medical examination without abnormal findings: Secondary | ICD-10-CM | POA: Diagnosis not present

## 2023-01-24 DIAGNOSIS — R82998 Other abnormal findings in urine: Secondary | ICD-10-CM | POA: Diagnosis not present

## 2023-01-24 DIAGNOSIS — E781 Pure hyperglyceridemia: Secondary | ICD-10-CM | POA: Diagnosis not present

## 2023-01-24 DIAGNOSIS — Z139 Encounter for screening, unspecified: Secondary | ICD-10-CM | POA: Diagnosis not present

## 2023-01-24 DIAGNOSIS — R109 Unspecified abdominal pain: Secondary | ICD-10-CM | POA: Diagnosis not present

## 2023-01-24 DIAGNOSIS — E1169 Type 2 diabetes mellitus with other specified complication: Secondary | ICD-10-CM | POA: Diagnosis not present

## 2023-01-24 DIAGNOSIS — D649 Anemia, unspecified: Secondary | ICD-10-CM | POA: Diagnosis not present

## 2023-01-24 DIAGNOSIS — E119 Type 2 diabetes mellitus without complications: Secondary | ICD-10-CM | POA: Diagnosis not present

## 2023-03-17 DIAGNOSIS — F330 Major depressive disorder, recurrent, mild: Secondary | ICD-10-CM | POA: Diagnosis not present

## 2023-03-29 ENCOUNTER — Encounter (HOSPITAL_COMMUNITY): Payer: Self-pay

## 2023-03-29 ENCOUNTER — Ambulatory Visit (HOSPITAL_COMMUNITY)
Admission: EM | Admit: 2023-03-29 | Discharge: 2023-03-29 | Disposition: A | Discharge status: Home / Self Care | Payer: Medicare Other | Attending: Emergency Medicine | Admitting: Emergency Medicine

## 2023-03-29 ENCOUNTER — Emergency Department (HOSPITAL_COMMUNITY): Payer: Medicare Other

## 2023-03-29 ENCOUNTER — Other Ambulatory Visit: Payer: Self-pay

## 2023-03-29 ENCOUNTER — Emergency Department (HOSPITAL_COMMUNITY)
Admission: EM | Admit: 2023-03-29 | Discharge: 2023-03-29 | Disposition: A | Discharge status: Home / Self Care | Payer: Medicare Other | Attending: Emergency Medicine | Admitting: Emergency Medicine

## 2023-03-29 DIAGNOSIS — R55 Syncope and collapse: Secondary | ICD-10-CM

## 2023-03-29 DIAGNOSIS — R109 Unspecified abdominal pain: Secondary | ICD-10-CM | POA: Insufficient documentation

## 2023-03-29 DIAGNOSIS — K5730 Diverticulosis of large intestine without perforation or abscess without bleeding: Secondary | ICD-10-CM | POA: Diagnosis not present

## 2023-03-29 DIAGNOSIS — D72829 Elevated white blood cell count, unspecified: Secondary | ICD-10-CM | POA: Insufficient documentation

## 2023-03-29 LAB — BASIC METABOLIC PANEL
Anion gap: 15 (ref 5–15)
BUN: 10 mg/dL (ref 6–20)
CO2: 25 mmol/L (ref 22–32)
Calcium: 10.3 mg/dL (ref 8.9–10.3)
Chloride: 101 mmol/L (ref 98–111)
Creatinine, Ser: 1.11 mg/dL (ref 0.61–1.24)
GFR, Estimated: >60 mL/min (ref >60)
Glucose, Bld: 129 mg/dL — ABNORMAL HIGH (ref 70–99)
Potassium: 4.2 mmol/L (ref 3.5–5.1)
Sodium: 141 mmol/L (ref 135–145)

## 2023-03-29 LAB — URINALYSIS, ROUTINE W REFLEX MICROSCOPIC
Bilirubin Urine: NEGATIVE
Glucose, UA: NEGATIVE mg/dL
Hgb urine dipstick: NEGATIVE
Ketones, ur: 80 mg/dL — AB
Leukocytes,Ua: NEGATIVE
Nitrite: NEGATIVE
Protein, ur: 30 mg/dL — AB
Specific Gravity, Urine: 1.03 (ref 1.005–1.030)
pH: 5 (ref 5.0–8.0)

## 2023-03-29 LAB — CBC
HCT: 41.5 % (ref 39.0–52.0)
Hemoglobin: 13.7 g/dL (ref 13.0–17.0)
MCH: 27.7 pg (ref 26.0–34.0)
MCHC: 33 g/dL (ref 30.0–36.0)
MCV: 83.8 fL (ref 80.0–100.0)
Platelets: 234 10*3/uL (ref 150–400)
RBC: 4.95 MIL/uL (ref 4.22–5.81)
RDW: 12.7 % (ref 11.5–15.5)
WBC: 12 10*3/uL — ABNORMAL HIGH (ref 4.0–10.5)
nRBC: 0 % (ref 0.0–0.2)

## 2023-03-29 LAB — POCT FASTING CBG KUC MANUAL ENTRY: POCT Glucose (KUC): 122 mg/dL — AB (ref 70–99)

## 2023-03-29 MED ORDER — IOHEXOL 350 MG/ML SOLN
75.0000 mL | Freq: Once | INTRAVENOUS | Status: AC | PRN
  Administered 2023-03-29: 75 mL via INTRAVENOUS

## 2023-03-29 MED ORDER — LACTATED RINGERS IV BOLUS
1000.0000 mL | Freq: Once | INTRAVENOUS | Status: AC
  Administered 2023-03-29: 1000 mL via INTRAVENOUS

## 2023-03-29 NOTE — ED Provider Notes (Signed)
[place] EMERGENCY DEPARTMENT AT [facility] Provider Note   CSN: [id number] Arrival date & time: 03/29/23  1249     History  Chief Complaint  Patient presents with   Multiple Syncopal events     Nathan Cruz is a 32 y.o. male.  HPI   Patient has a history of ADHD, depression who presents to the ED for evaluation of syncopal episodes.  Patient states he has had a few episodes over the last several days.  Patient states he had 1 episode where he was feeling poorly he felt very lightheaded went to sit down on the couch and then had a syncopal episode.  No reported seizure activity.  Patient states she has had a couple more episodes associated with lightheadedness today.  No injuries.  Patient has been having abdominal discomfort although it is getting somewhat better now.  He denies any diarrhea but has had some nausea vomiting.  He also has noticed that his urine is dark he has not been eating or drinking as much.  She went to an urgent care and then sent to the ED  Home Medications Prior to Admission medications   Medication Sig Start Date End Date Taking? Authorizing Provider  acetaminophen (TYLENOL) 500 MG tablet Take 500 mg by mouth every 6 (six) hours as needed for mild pain.   Yes [provider]  Multiple Vitamin (MULTIVITAMIN WITH MINERALS) TABS tablet Take 1 tablet by mouth daily.   Yes [provider]  pantoprazole (PROTONIX) 40 MG tablet TAKE 1 TABLET BY MOUTH EVERY DAY 11/15/22  Yes Zehr, Princella Pellegrini, PA-C      Allergies    Prednisone    Review of Systems   Review of Systems  Physical Exam Updated Vital Signs BP 117/77   Pulse 97   Temp 98.5 F (36.9 C) (Oral)   Resp 15   Ht 1.727 m (5\' 8" )   Wt 77.1 kg   SpO2 100%   BMI 25.85 kg/m  Physical Exam Vitals and nursing note reviewed.  Constitutional:      General: He is not in acute distress.    Appearance: He is well-developed.  HENT:     Head: Normocephalic and atraumatic.      Right Ear: External ear normal.     Left Ear: External ear normal.  Eyes:     General: No scleral icterus.       Right eye: No discharge.        Left eye: No discharge.     Conjunctiva/sclera: Conjunctivae normal.  Neck:     Trachea: No tracheal deviation.  Cardiovascular:     Rate and Rhythm: Normal rate and regular rhythm.  Pulmonary:     Effort: Pulmonary effort is normal. No respiratory distress.     Breath sounds: Normal breath sounds. No stridor. No wheezing or rales.  Abdominal:     General: Bowel sounds are normal. There is no distension.     Palpations: Abdomen is soft.     Tenderness: There is abdominal tenderness. There is no guarding or rebound.  Musculoskeletal:        General: No tenderness or deformity.     Cervical back: Neck supple.  Skin:    General: Skin is warm and dry.     Findings: No rash.  Neurological:     General: No focal deficit present.     Mental Status: He is alert.     Cranial Nerves: No cranial nerve deficit,  dysarthria or facial asymmetry.     Sensory: No sensory deficit.     Motor: No abnormal muscle tone or seizure activity.     Coordination: Coordination normal.  Psychiatric:        Mood and Affect: Mood normal.     ED Results / Procedures / Treatments   Labs (all labs ordered are listed, but only abnormal results are displayed) Labs Reviewed  BASIC METABOLIC PANEL - Abnormal; Notable for the following components:      Result Value   Glucose, Bld 129 (*)    All other components within normal limits  CBC - Abnormal; Notable for the following components:   WBC 12.0 (*)    All other components within normal limits  URINALYSIS, ROUTINE W REFLEX MICROSCOPIC - Abnormal; Notable for the following components:   Color, Urine AMBER (*)    APPearance HAZY (*)    Ketones, ur 80 (*)    Protein, ur 30 (*)    Bacteria, UA RARE (*)    All other components within normal limits    EKG EKG Interpretation Date/Time:  Saturday March 29 2023 13:06:55 EDT Ventricular Rate:  94 PR Interval:  154 QRS Duration:  84 QT Interval:  356 QTC Calculation: 445 R Axis:   111  Text Interpretation: Normal sinus rhythm Right axis deviation Pulmonary disease pattern Abnormal ECG No previous ECGs available Confirmed by Linwood Dibbles [phone]) on 03/29/2023 3:29:38 PM  Radiology CT ABDOMEN PELVIS W CONTRAST  Result Date: 03/29/2023 CLINICAL DATA:  Abdominal pain, acute, nonlocalized. EXAM: CT ABDOMEN AND PELVIS WITH CONTRAST TECHNIQUE: Multidetector CT imaging of the abdomen and pelvis was performed using the standard protocol following bolus administration of intravenous contrast. RADIATION DOSE REDUCTION: This exam was performed according to the departmental dose-optimization program which includes automated exposure control, adjustment of the mA and/or kV according to patient size and/or use of iterative reconstruction technique. CONTRAST:  75mL OMNIPAQUE IOHEXOL 350 MG/ML SOLN COMPARISON:  Ultrasound 10/11/2020 FINDINGS: Lower chest: Normal Hepatobiliary: Liver parenchyma appears normal by CT. No focal lesion. No calcified gallstones. No ductal dilatation. Pancreas: Normal Spleen: Normal Adrenals/Urinary Tract: Adrenal glands are normal. Kidneys are normal. No mass, stone or hydronephrosis. Bladder is normal. Stomach/Bowel: Stomach and small bowel are normal. Normal appendix. Mild diverticulosis of the left colon. No visible diverticulitis. Vascular/Lymphatic: Aorta and IVC are normal.  No adenopathy. Reproductive: Normal Other: No free fluid or air.  No hernia. 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 IMPRESSION: 1. No acute finding to explain the clinical presentation. 2. Mild diverticulosis of the left colon without evidence of diverticulitis. 3. 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 Electronically Signed   By: Paulina Fusi M.D.   On: 03/29/2023 17:16    Procedures Procedures     Medications Ordered in ED Medications  lactated ringers bolus 1,000 mL (0 mLs Intravenous Stopped 03/29/23 1857)  iohexol (OMNIPAQUE) 350 MG/ML injection 75 mL (75 mLs Intravenous Contrast Given 03/29/23 1649)    ED Course/ Medical Decision Making/ A&P Clinical Course as of 03/29/23 2016  Sat Mar 29, 2023  1635 Urinalysis shows ketones.  No signs of infection.  CBC shows an elevated white blood count 7.  Metabolic panel normal. [JK]  1824 CT scan without acute finding [JK]    Clinical Course User Index [JK] Linwood Dibbles, MD  Medical Decision Making Problems Addressed: Syncope, unspecified syncope type: acute illness or injury that poses a threat to life or bodily functions  Amount and/or Complexity of Data Reviewed Labs: ordered. Decision-making details documented in ED Course. Radiology: ordered and independent interpretation performed.  Risk Prescription drug management.   Patient presented to the ED for evaluation of syncopal episodes.  Patient also mentioned having general malaise recently and some abdominal discomfort.  He did have episodes of nausea vomiting.  Labs notable for leukocytosis.  Otherwise no signs of severe dehydration.  His urinalysis did show increased ketones suggesting a component of dehydration.  CT scan was performed and no signs of any acute abnormality in his abdomen .  No findings of appendicitis hepatitis pancreatitis.  Patient did not have any recurrent vomiting the ED.  He was given IV fluids.  Possible he is having vasovagal episodes.  This also may have been related to orthostatic hypotension from his nausea and vomiting.  Will recommend outpatient follow-up with cardiology.  He may benefit from outpatient cardiac monitoring. Evaluation and diagnostic testing in the emergency department does not suggest an emergent condition requiring admission or immediate intervention beyond what has been performed at this time.  The  patient is safe for discharge and has been instructed to return immediately for worsening symptoms, change in symptoms or any other concerns.       Final Clinical Impression(s) / ED Diagnoses Final diagnoses:  Syncope, unspecified syncope type    Rx / DC Orders ED Discharge Orders          Ordered    Ambulatory referral to Cardiology       Comments: If you have not heard from the Cardiology office within the next 72 hours please call [phone].   03/29/23 1849              Linwood Dibbles, MD 03/29/23 2018

## 2023-03-29 NOTE — ED Notes (Signed)
Patient is being discharged from the Urgent Care and sent to the Emergency Department via POV . Per [place] Garrison NP, patient is in need of higher level of care due to LOC. Patient is aware and verbalizes understanding of plan of care.  Vitals:   03/29/23 1227  BP: 112/74  Pulse: 97  Resp: 18  Temp: 98.6 F (37 C)  SpO2: 97%

## 2023-03-29 NOTE — ED Triage Notes (Addendum)
Patient states fainted 4 times in last 3 days. States the first time was on Wednesday. No history of LOC or brain injuries or health conditions. Patient states one time he fainted he was just standing in his grandmothers room trying to pull a snack box open. Patient has fainted while standing and sitting down. No strenuous activity or particular position changes going on when he faints.  States 2-3 days before he first fainted woke up with abdominal pain and emesis. States after emesis the second time that day he felt better.   Patient is diabetic but states it is controlled. Has been off meds for this is under dietary control.   Patient states that this past week he has been very cold. However, when he has a fainting episode he becomes hot and diaphoretic. States that EMS has come out to check on him the times he fainted. EKGs, vitals, and blood sugar checks done and all came back normal.

## 2023-03-29 NOTE — ED Triage Notes (Signed)
Pt came in via POV d/t multiple syncopal events in the past several days. He reports 3 days ago he felt a "wave of crummy feeling come over" & then he woke up in the floor, he did have a witnessed syncopal event. He had 2 more syncopal events yesterday & then once more this morning. A/Ox4, reports recent n/v with abd pain & light headed feeling. He went to [facility] but they sent him here.

## 2023-03-29 NOTE — ED Provider Notes (Signed)
Patient presents to clinic with complaints of recurrent syncopal episodes since Wednesday.  Initial loss of consciousness was on Wednesday from standing where he hit his head. It was preceded by a general feeling of unease and weakness, passed out again on Thursday this was also witnessed.  The last 2 times reports they were unwitnessed but seated.  No history of syncope or loss of consciousness.  He does have diet-controlled diabetes, CBG 122 in clinic.  Had a recent syncopal episode at his desk at this morning that lasted for a few seconds.  This previous Saturday and Sunday he did have a few episodes of nausea with emesis.  After emesis he did felt better.  Over the past week he has been having chills.  After these events he was evaluated by EMS where he had normal vital signs and a regular EKG.   Advised due to recurrent syncopal episodes, that he would benefit from further evaluation at the nearest emergency department.  Patient verbalized understanding, no questions at this time.  Will head to [facility] via POV.   Nathan Cruz, [place] N, [place] 03/29/23 1246

## 2023-03-29 NOTE — Discharge Instructions (Addendum)
Drink plenty of fluids.  Stay well-hydrated.  Follow-up with a cardiologist for further evaluation of your fainting spells.  Return to the ED for any recurrent episodes

## 2023-03-29 NOTE — ED Notes (Signed)
Pt in NAD at d/c from ED. A&O. Ambulatory with steady gait. Respirations even & unlabored. Skin warm & dry. Pt verbalized understanding of d/c teaching including follow up care and reasons to return to the ED. No needs or questions expressed at d/c.

## 2023-03-31 DIAGNOSIS — F330 Major depressive disorder, recurrent, mild: Secondary | ICD-10-CM | POA: Diagnosis not present

## 2023-04-18 DIAGNOSIS — F330 Major depressive disorder, recurrent, mild: Secondary | ICD-10-CM | POA: Diagnosis not present

## 2023-04-22 DIAGNOSIS — F330 Major depressive disorder, recurrent, mild: Secondary | ICD-10-CM | POA: Diagnosis not present

## 2023-05-02 ENCOUNTER — Telehealth: Payer: Self-pay

## 2023-05-02 NOTE — Telephone Encounter (Signed)
Transition Care Management Unsuccessful Follow-up Telephone Call  Date of discharge and from where:  03/29/2023 [facility]  Attempts:  2nd Attempt  Reason for unsuccessful TCM follow-up call:  Unable to leave message voicemail not setup.  Zyla Dascenzo [facility]  [facility], [facility] Guide Direct Dial: [phone]  Website: [email or web address]

## 2023-05-02 NOTE — Telephone Encounter (Signed)
Transition Care Management Unsuccessful Follow-up Telephone Call  Date of discharge and from where:  03/29/2023 [facility]  Attempts:  1st Attempt  Reason for unsuccessful TCM follow-up call:  No answer/busy  Mardene Lessig [facility]  [facility], [facility] Resource Care Guide Direct Dial: [phone]  Website: [email or web address]

## 2023-05-20 NOTE — Progress Notes (Unsigned)
  Cardiology Office Note:  .   Date:  05/21/2023  ID:  Nathan Cruz, DOB 09/08/1990, MRN [id number] PCP: Charlane Ferretti, DO  [facility] Providers Cardiologist:  None {  History of Present Illness: .   Nathan Cruz is a 32 y.o. male with history of ADHD who presents for the evaluation of syncope at the request of Charlane Ferretti, DO. Seen in ER 9/28 for syncope. EKG normal.   History of Present Illness   Nathan Cruz, a 32 year old male with a history of ADHD and diet-controlled diabetes, presents for evaluation of syncope. He reports three episodes of syncope, the first two occurring on consecutive days and the third leading to his ER visit. Each episode was preceded by a "crummy" feeling and followed by profuse sweating. He has not had any more episodes of syncope since his ER visit on 03/29/2023, but has experienced the same "crummy" feeling and sweating. He denies any chest pain, trouble breathing, or palpitations before the episodes. He also denies any seizure-like activity or loss of bowel or bladder control during the episodes. 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 He does not eat breakfast regularly and his fluid intake varies. No CP, SOB, or palpitations reported prior to the episodes. No seizure like activity. No urination or defecation. Recovers quickly. No new medications.        ROS: All other ROS reviewed and negative. Pertinent positives noted in the HPI.     Studies Reviewed: Marland Kitchen   EKG Interpretation Date/Time:  Wednesday May 21 2023 15:27:59 EST Ventricular Rate:  67 PR Interval:  160 QRS Duration:  86 QT Interval:  396 QTC Calculation: 418 R Axis:   82  Text Interpretation: Normal sinus rhythm with sinus arrhythmia Normal ECG Confirmed by Lennie Odor [phone]) on 05/21/2023 3:31:53 PM   Physical Exam:   VS:  BP 98/60 (BP Location: Right Arm, Patient Position: Sitting, Cuff Size:  Normal)   Pulse 67   Ht 5\' 8"  (1.727 m)   Wt 172 lb (78 kg)   SpO2 97%   BMI 26.15 kg/m    Wt Readings from Last 3 Encounters:  05/21/23 172 lb (78 kg)  03/29/23 170 lb (77.1 kg)  04/01/22 179 lb (81.2 kg)    GEN: Well nourished, well developed in no acute distress NECK: No JVD; No carotid bruits CARDIAC: RRR, no murmurs, rubs, gallops RESPIRATORY:  Clear to auscultation without rales, wheezing or rhonchi  ABDOMEN: Soft, non-tender, non-distended EXTREMITIES:  No edema; No deformity  ASSESSMENT AND PLAN: .   Assessment and Plan    Syncope Multiple episodes of syncope with prodromal symptoms suggestive of vasovagal syncope. No associated chest pain, palpitations, or incontinence. No history of seizures. Normal EKG and cardiovascular exam. -Order CBC, TSH, iron profile, B12 level, and echocardiogram to rule out other causes. -Advise to increase fluid intake to 80-100 ounces per day, eat regularly, and reduce marijuana use. -Advise to sit down when prodromal symptoms occur to prevent syncope. -No need to restrict driving.  -PRN follow-up based on above testing.              Follow-up: Return if symptoms worsen or fail to improve.  Signed, Lenna Gilford. Flora Lipps, MD, [facility]  [facility]  [address] [place], [place] [address] [phone]  4:20 PM

## 2023-05-21 ENCOUNTER — Ambulatory Visit: Payer: Medicare Other | Attending: Cardiovascular Disease | Admitting: Cardiovascular Disease

## 2023-05-21 ENCOUNTER — Encounter: Payer: Self-pay | Admitting: Cardiovascular Disease

## 2023-05-21 VITALS — BP 98/60 | HR 67 | Ht 68.0 in | Wt 172.0 lb

## 2023-05-21 DIAGNOSIS — R55 Syncope and collapse: Secondary | ICD-10-CM

## 2023-05-21 DIAGNOSIS — R0602 Shortness of breath: Secondary | ICD-10-CM

## 2023-05-21 DIAGNOSIS — R0609 Other forms of dyspnea: Secondary | ICD-10-CM | POA: Diagnosis not present

## 2023-05-21 NOTE — Patient Instructions (Signed)
Medication Instructions:  No changes  *If you need a refill on your cardiac medications before your next appointment, please call your pharmacy*   Lab Work: Cbc Iron Tsh B12   If you have labs (blood work) drawn today and your tests are completely normal, you will receive your results only by: MyChart Message (if you have MyChart) OR A paper copy in the mail If you have any lab test that is abnormal or we need to change your treatment, we will call you to review the results.   Testing/Procedures: Echo will be scheduled at [address].  Your physician has requested that you have an echocardiogram. Echocardiography is a painless test that uses sound waves to create images of your heart. It provides your doctor with information about the size and shape of your heart and how well your heart's chambers and valves are working. This procedure takes approximately one hour. There are no restrictions for this procedure. Please do NOT wear cologne, perfume, aftershave, or lotions (deodorant is allowed). Please arrive 15 minutes prior to your appointment time.    Follow-Up: At [facility], you and your health needs are our priority.  As part of our continuing mission to provide you with exceptional heart care, we have created designated Provider Care Teams.  These Care Teams include your primary Cardiologist (physician) and Advanced Practice Providers (APPs -  Physician Assistants and Nurse Practitioners) who all work together to provide you with the care you need, when you need it.  We recommend signing up for the patient portal called "MyChart".  Sign up information is provided on this After Visit Summary.  MyChart is used to connect with patients for Virtual Visits (Telemedicine).  Patients are able to view lab/test results, encounter notes, upcoming appointments, etc.  Non-urgent messages can be sent to your provider as well.   To learn more about what you can do with MyChart,  go to [email or web address].    Your next appointment:   Follow up as needed   The format for your next appointment:   In Person  Provider:   Dr. Lennie Odor, MD   Other Instructions

## 2023-05-22 LAB — IRON,TIBC AND FERRITIN PANEL
Ferritin: 220 ng/mL (ref 30–400)
Iron Saturation: 20 % (ref 15–55)
Iron: 62 ug/dL (ref 38–169)
Total Iron Binding Capacity: 310 ug/dL (ref 250–450)
UIBC: 248 ug/dL (ref 111–343)

## 2023-05-22 LAB — TSH: TSH: 1.21 u[IU]/mL (ref 0.450–4.500)

## 2023-05-22 LAB — CBC
Hematocrit: 35 % — ABNORMAL LOW (ref 37.5–51.0)
Hemoglobin: 11.5 g/dL — ABNORMAL LOW (ref 13.0–17.7)
MCH: 28.1 pg (ref 26.6–33.0)
MCHC: 32.9 g/dL (ref 31.5–35.7)
MCV: 86 fL (ref 79–97)
Platelets: 214 10*3/uL (ref 150–450)
RBC: 4.09 x10E6/uL — ABNORMAL LOW (ref 4.14–5.80)
RDW: 12.4 % (ref 11.6–15.4)
WBC: 6.7 10*3/uL (ref 3.4–10.8)

## 2023-05-22 LAB — VITAMIN B12: Vitamin B-12: 493 pg/mL (ref 232–1245)

## 2023-06-04 DIAGNOSIS — F330 Major depressive disorder, recurrent, mild: Secondary | ICD-10-CM | POA: Diagnosis not present

## 2023-06-26 ENCOUNTER — Ambulatory Visit (HOSPITAL_COMMUNITY): Payer: Medicare Other | Attending: Cardiovascular Disease

## 2023-06-26 DIAGNOSIS — R55 Syncope and collapse: Secondary | ICD-10-CM | POA: Diagnosis not present

## 2023-06-26 LAB — ECHOCARDIOGRAM COMPLETE
Area-P 1/2: 3.65 cm2
S' Lateral: 2.8 cm

## 2023-06-27 ENCOUNTER — Encounter: Payer: Self-pay | Admitting: *Deleted

## 2023-07-15 DIAGNOSIS — Z23 Encounter for immunization: Secondary | ICD-10-CM | POA: Diagnosis not present

## 2024-01-26 DIAGNOSIS — E1169 Type 2 diabetes mellitus with other specified complication: Secondary | ICD-10-CM | POA: Diagnosis not present

## 2024-01-26 DIAGNOSIS — E46 Unspecified protein-calorie malnutrition: Secondary | ICD-10-CM | POA: Diagnosis not present

## 2024-01-26 DIAGNOSIS — E781 Pure hyperglyceridemia: Secondary | ICD-10-CM | POA: Diagnosis not present

## 2024-01-26 DIAGNOSIS — D649 Anemia, unspecified: Secondary | ICD-10-CM | POA: Diagnosis not present

## 2024-01-29 DIAGNOSIS — E663 Overweight: Secondary | ICD-10-CM | POA: Diagnosis not present

## 2024-01-29 DIAGNOSIS — H6120 Impacted cerumen, unspecified ear: Secondary | ICD-10-CM | POA: Diagnosis not present

## 2024-01-29 DIAGNOSIS — Z0000 Encounter for general adult medical examination without abnormal findings: Secondary | ICD-10-CM | POA: Diagnosis not present

## 2024-01-29 DIAGNOSIS — E781 Pure hyperglyceridemia: Secondary | ICD-10-CM | POA: Diagnosis not present

## 2024-01-29 DIAGNOSIS — D649 Anemia, unspecified: Secondary | ICD-10-CM | POA: Diagnosis not present

## 2024-01-29 DIAGNOSIS — Z1331 Encounter for screening for depression: Secondary | ICD-10-CM | POA: Diagnosis not present

## 2024-01-29 DIAGNOSIS — E1169 Type 2 diabetes mellitus with other specified complication: Secondary | ICD-10-CM | POA: Diagnosis not present

## 2024-01-29 DIAGNOSIS — R5383 Other fatigue: Secondary | ICD-10-CM | POA: Diagnosis not present

## 2024-01-29 DIAGNOSIS — R82998 Other abnormal findings in urine: Secondary | ICD-10-CM | POA: Diagnosis not present

## 2024-01-29 DIAGNOSIS — R109 Unspecified abdominal pain: Secondary | ICD-10-CM | POA: Diagnosis not present

## 2024-01-29 DIAGNOSIS — Z1339 Encounter for screening examination for other mental health and behavioral disorders: Secondary | ICD-10-CM | POA: Diagnosis not present

## 2024-02-25 DIAGNOSIS — H6123 Impacted cerumen, bilateral: Secondary | ICD-10-CM | POA: Diagnosis not present

## 2024-04-06 DIAGNOSIS — F900 Attention-deficit hyperactivity disorder, predominantly inattentive type: Secondary | ICD-10-CM | POA: Diagnosis not present

## 2024-04-06 DIAGNOSIS — Z5181 Encounter for therapeutic drug level monitoring: Secondary | ICD-10-CM | POA: Diagnosis not present

## 2024-04-06 DIAGNOSIS — F331 Major depressive disorder, recurrent, moderate: Secondary | ICD-10-CM | POA: Diagnosis not present

## 2024-05-05 DIAGNOSIS — F331 Major depressive disorder, recurrent, moderate: Secondary | ICD-10-CM | POA: Diagnosis not present

## 2024-05-05 DIAGNOSIS — F900 Attention-deficit hyperactivity disorder, predominantly inattentive type: Secondary | ICD-10-CM | POA: Diagnosis not present

## 2024-06-04 DIAGNOSIS — Z23 Encounter for immunization: Secondary | ICD-10-CM | POA: Diagnosis not present
